# Patient Record
Sex: Female | Born: 1960 | Race: Black or African American | Hispanic: No | Smoking: Former smoker
Health system: Southern US, Community
[De-identification: ages and names within clinical notes are randomized; demographics above are authoritative.]

## PROBLEM LIST (undated history)

## (undated) DIAGNOSIS — E119 Type 2 diabetes mellitus without complications: Secondary | ICD-10-CM

## (undated) DIAGNOSIS — I10 Essential (primary) hypertension: Secondary | ICD-10-CM

## (undated) DIAGNOSIS — S83249A Other tear of medial meniscus, current injury, unspecified knee, initial encounter: Secondary | ICD-10-CM

## (undated) DIAGNOSIS — J302 Other seasonal allergic rhinitis: Secondary | ICD-10-CM

## (undated) DIAGNOSIS — G4730 Sleep apnea, unspecified: Secondary | ICD-10-CM

## (undated) DIAGNOSIS — F419 Anxiety disorder, unspecified: Secondary | ICD-10-CM

## (undated) DIAGNOSIS — M1990 Unspecified osteoarthritis, unspecified site: Secondary | ICD-10-CM

## (undated) DIAGNOSIS — T7840XA Allergy, unspecified, initial encounter: Secondary | ICD-10-CM

## (undated) DIAGNOSIS — E785 Hyperlipidemia, unspecified: Secondary | ICD-10-CM

## (undated) DIAGNOSIS — K219 Gastro-esophageal reflux disease without esophagitis: Secondary | ICD-10-CM

## (undated) DIAGNOSIS — M940 Chondrocostal junction syndrome [Tietze]: Secondary | ICD-10-CM

## (undated) HISTORY — PX: SPLENECTOMY: SUR1306

## (undated) HISTORY — DX: Gastro-esophageal reflux disease without esophagitis: K21.9

## (undated) HISTORY — DX: Other seasonal allergic rhinitis: J30.2

## (undated) HISTORY — DX: Type 2 diabetes mellitus without complications: E11.9

## (undated) HISTORY — DX: Hyperlipidemia, unspecified: E78.5

## (undated) HISTORY — DX: Allergy, unspecified, initial encounter: T78.40XA

## (undated) HISTORY — DX: Unspecified osteoarthritis, unspecified site: M19.90

## (undated) HISTORY — DX: Anxiety disorder, unspecified: F41.9

## (undated) HISTORY — DX: Essential (primary) hypertension: I10

---

## 1991-09-17 HISTORY — PX: OTHER SURGICAL HISTORY: SHX169

## 2014-09-05 ENCOUNTER — Other Ambulatory Visit: Payer: Self-pay | Admitting: Primary Care

## 2014-09-05 DIAGNOSIS — Z1231 Encounter for screening mammogram for malignant neoplasm of breast: Secondary | ICD-10-CM

## 2014-09-21 ENCOUNTER — Ambulatory Visit: Payer: Self-pay

## 2015-03-23 ENCOUNTER — Encounter: Payer: Self-pay | Admitting: Internal Medicine

## 2015-05-18 ENCOUNTER — Ambulatory Visit (AMBULATORY_SURGERY_CENTER): Payer: Self-pay | Admitting: *Deleted

## 2015-05-18 VITALS — Ht 65.0 in | Wt 213.4 lb

## 2015-05-18 DIAGNOSIS — Z1211 Encounter for screening for malignant neoplasm of colon: Secondary | ICD-10-CM

## 2015-05-18 NOTE — Progress Notes (Signed)
Denies allergies to eggs or soy products. Denies complications with sedation or anesthesia. Denies O2 use. Denies use of diet or weight loss medications.  Emmi instructions given for colonoscopy.  

## 2015-06-01 ENCOUNTER — Encounter: Payer: Self-pay | Admitting: Internal Medicine

## 2015-06-26 ENCOUNTER — Encounter: Payer: Self-pay | Admitting: Gastroenterology

## 2015-08-03 ENCOUNTER — Encounter: Payer: Self-pay | Admitting: Gastroenterology

## 2015-08-14 ENCOUNTER — Encounter: Payer: 59 | Admitting: Gastroenterology

## 2015-09-19 ENCOUNTER — Emergency Department (HOSPITAL_COMMUNITY)
Admission: EM | Admit: 2015-09-19 | Discharge: 2015-09-19 | Disposition: A | Discharge status: Home / Self Care | Payer: Self-pay | Source: Home / Self Care | Attending: Emergency Medicine | Admitting: Emergency Medicine

## 2015-09-19 ENCOUNTER — Encounter (HOSPITAL_COMMUNITY): Payer: Self-pay | Admitting: *Deleted

## 2015-09-19 ENCOUNTER — Emergency Department (INDEPENDENT_AMBULATORY_CARE_PROVIDER_SITE_OTHER): Payer: BLUE CROSS/BLUE SHIELD

## 2015-09-19 DIAGNOSIS — R42 Dizziness and giddiness: Secondary | ICD-10-CM

## 2015-09-19 DIAGNOSIS — J3489 Other specified disorders of nose and nasal sinuses: Secondary | ICD-10-CM

## 2015-09-19 NOTE — Discharge Instructions (Signed)
Dizziness °Dizziness is a common problem. It makes you feel unsteady or lightheaded. You may feel like you are about to pass out (faint). Dizziness can lead to injury if you stumble or fall. Anyone can get dizzy, but dizziness is more common in older adults. This condition can be caused by a number of things, including: °· Medicines. °· Dehydration. °· Illness. °HOME CARE °Following these instructions may help with your condition: °Eating and Drinking °· Drink enough fluid to keep your pee (urine) clear or pale yellow. This helps to keep you from getting dehydrated. Try to drink more clear fluids, such as water. °· Do not drink alcohol. °· Limit how much caffeine you drink or eat if told by your doctor. °· Limit how much salt you drink or eat if told by your doctor. °Activity °· Avoid making quick movements. °¨ When you stand up from sitting in a chair, steady yourself until you feel okay. °¨ In the morning, first sit up on the side of the bed. When you feel okay, stand slowly while you hold onto something. Do this until you know that your balance is fine. °· Move your legs often if you need to stand in one place for a long time. Tighten and relax your muscles in your legs while you are standing. °· Do not drive or use heavy machinery if you feel dizzy. °· Avoid bending down if you feel dizzy. Place items in your home so that they are easy for you to reach without leaning over. °Lifestyle °· Do not use any tobacco products, including cigarettes, chewing tobacco, or electronic cigarettes. If you need help quitting, ask your doctor. °· Try to lower your stress level, such as with yoga or meditation. Talk with your doctor if you need help. °General Instructions °· Watch your dizziness for any changes. °· Take medicines only as told by your doctor. Talk with your doctor if you think that your dizziness is caused by a medicine that you are taking. °· Tell a friend or a family member that you are feeling dizzy. If he or  she notices any changes in your behavior, have this person call your doctor. °· Keep all follow-up visits as told by your doctor. This is important. °GET HELP IF: °· Your dizziness does not go away. °· Your dizziness or light-headedness gets worse. °· You feel sick to your stomach (nauseous). °· You have trouble hearing. °· You have new symptoms. °· You are unsteady on your feet or you feel like the room is spinning. °GET HELP RIGHT AWAY IF: °· You throw up (vomit) or have diarrhea and are unable to eat or drink anything. °· You have trouble: °¨ Talking. °¨ Walking. °¨ Swallowing. °¨ Using your arms, hands, or legs. °· You feel generally weak. °· You are not thinking clearly or you have trouble forming sentences. It may take a friend or family member to notice this. °· You have: °¨ Chest pain. °¨ Pain in your belly (abdomen). °¨ Shortness of breath. °¨ Sweating. °· Your vision changes. °· You are bleeding. °· You have a headache. °· You have neck pain or a stiff neck. °· You have a fever. °  °This information is not intended to replace advice given to you by your health care provider. Make sure you discuss any questions you have with your health care provider. °  °Document Released: 08/22/2011 Document Revised: 01/17/2015 Document Reviewed: 08/29/2014 °Elsevier Interactive Patient Education ©2016 [employer] ° °

## 2015-09-19 NOTE — ED Provider Notes (Signed)
CSN: [id number]     Arrival date & time 09/19/15  1308 History   First MD Initiated Contact with Patient 09/19/15 1354     Chief Complaint  Patient presents with  . URI   (Consider location/radiation/quality/duration/timing/severity/associated sx's/prior Treatment) HPI Pt states that she was cooking and experienced an episode of dizziness that lasted about 1 minute. Also some nasal pain and pressure. Symptoms completely resolved and did not return.  Past Medical History  Diagnosis Date  . Seasonal allergies   . Anxiety   . Arthritis   . Diabetes (HCC)   . GERD (gastroesophageal reflux disease)   . Hyperlipidemia   . Hypertension    Past Surgical History  Procedure Laterality Date  . Splenectomy  childhood  . Ceasarean section  81993   Family History  Problem Relation Age of Onset  . Colon cancer Neg Hx    Social History  Substance Use Topics  . Smoking status: Former Smoker    Quit date: 09/16/1984  . Smokeless tobacco: Never Used  . Alcohol Use: 0.0 oz/week    0 Standard drinks or equivalent per week     Comment: occas   OB History    No data available     Review of Systems ROS +'ve sinus pain/pressure dizzy  Denies: HEADACHE, NAUSEA, ABDOMINAL PAIN, CHEST PAIN, CONGESTION, DYSURIA, SHORTNESS OF BREATH  Allergies  Aspirin  Home Medications   Prior to Admission medications   Medication Sig Start Date End Date Taking? Authorizing Provider  atenolol (TENORMIN) 25 MG tablet Take by mouth daily.    Historical Provider, MD  bisacodyl (BISACODYL) 5 MG EC tablet Take 5 mg by mouth once. One time use for colonoscopy    Historical Provider, MD  hydrOXYzine (ATARAX/VISTARIL) 50 MG tablet Take 50 mg by mouth 3 (three) times daily as needed.    Historical Provider, MD  ibuprofen (ADVIL,MOTRIN) 600 MG tablet Take 600 mg by mouth every 6 (six) hours as needed.    Historical Provider, MD  loratadine (CLARITIN) 10 MG tablet Take 10 mg by mouth daily.    Historical Provider, MD   metFORMIN (GLUCOPHAGE) 500 MG tablet Take by mouth daily with breakfast.    Historical Provider, MD  naproxen sodium (ANAPROX) 220 MG tablet Take 220 mg by mouth as needed.    Historical Provider, MD  omeprazole (PRILOSEC) 20 MG capsule Take 20 mg by mouth daily.    Historical Provider, MD  polyethylene glycol (MIRALAX / GLYCOLAX) packet Take 17 g by mouth once. One time use for colonoscopy    Historical Provider, MD  pravastatin (PRAVACHOL) 20 MG tablet Take 20 mg by mouth daily.    Historical Provider, MD  sertraline (ZOLOFT) 25 MG tablet Take 25 mg by mouth daily.    Historical Provider, MD   Meds Ordered and Administered this Visit  Medications - No data to display  BP 132/90 mmHg  Pulse 94  Temp(Src) 98.4 F (36.9 C) (Oral)  Resp 16  SpO2 98%  LMP 05/17/2012 (Approximate) No data found.   Physical Exam  Constitutional: She is oriented to person, place, and time. She appears well-developed and well-nourished.  HENT:  Head: Normocephalic and atraumatic.  Right Ear: External ear normal.  Left Ear: External ear normal.  Nose: Nose normal.  Mouth/Throat: Oropharynx is clear and moist.  Eyes: Conjunctivae are normal.  Neck: Normal range of motion.  Pulmonary/Chest: Effort normal and breath sounds normal.  Musculoskeletal: Normal range of motion.  Lymphadenopathy:  She has no cervical adenopathy.  Neurological: She is alert and oriented to person, place, and time.  Skin: Skin is warm and dry.  Psychiatric: She has a normal mood and affect. Her behavior is normal. Judgment and thought content normal.    ED Course  Procedures (including critical care time)  Labs Review Labs Reviewed - No data to display  Imaging Review Dg Sinuses Complete  09/19/2015  CLINICAL DATA:  Sinus pain and pressure. Dizziness. No fever. Productive sinus drainage. EXAM: PARANASAL SINUSES - COMPLETE 3 + VIEW COMPARISON:  None. FINDINGS: The paranasal sinus are aerated. There is no evidence of  sinus opacification air-fluid levels or mucosal thickening. No significant bone abnormalities are seen. IMPRESSION: Negative. Electronically Signed   By: Gerome Sam III M.D   On: 09/19/2015 14:48     Visual Acuity Review  Right Eye Distance:   Left Eye Distance:   Bilateral Distance:    Right Eye Near:   Left Eye Near:    Bilateral Near:         MDM   1. Sinus pressure   2. Sinus pain   3. Dizzy spells    No worrying findings on sinus films.  Continue symptomatic care at this time.  Push fluids, diet and activity as tolerated.     Tharon Aquas, PA 09/19/15 [phone]

## 2015-09-19 NOTE — ED Notes (Signed)
Pt  Reports  Symptoms  Of  dizzyness     Headache    Sinus  Pressure     Symptoms  Started  About    4  Days  Ago          Pt  Reports   Symptoms      Not  releived  By otc   meds

## 2016-12-05 ENCOUNTER — Ambulatory Visit: Payer: BLUE CROSS/BLUE SHIELD | Attending: Primary Care

## 2016-12-05 DIAGNOSIS — M6281 Muscle weakness (generalized): Secondary | ICD-10-CM | POA: Diagnosis present

## 2016-12-05 DIAGNOSIS — M545 Low back pain, unspecified: Secondary | ICD-10-CM

## 2016-12-05 DIAGNOSIS — M62830 Muscle spasm of back: Secondary | ICD-10-CM | POA: Insufficient documentation

## 2016-12-05 NOTE — Patient Instructions (Signed)
From cabinet  HEP 2x/day 30 sec stretching x2-3 reps RT/Lt    Single knee to chest,  LTR ,  Childs pose,  PPT,
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
## 2016-12-16 ENCOUNTER — Ambulatory Visit: Payer: BLUE CROSS/BLUE SHIELD

## 2017-07-23 ENCOUNTER — Ambulatory Visit (INDEPENDENT_AMBULATORY_CARE_PROVIDER_SITE_OTHER): Payer: BLUE CROSS/BLUE SHIELD

## 2017-07-23 ENCOUNTER — Ambulatory Visit (HOSPITAL_COMMUNITY)
Admission: EM | Admit: 2017-07-23 | Discharge: 2017-07-23 | Disposition: A | Discharge status: Home / Self Care | Payer: BLUE CROSS/BLUE SHIELD | Attending: Family Medicine | Admitting: Family Medicine

## 2017-07-23 ENCOUNTER — Other Ambulatory Visit: Payer: Self-pay

## 2017-07-23 ENCOUNTER — Encounter (HOSPITAL_COMMUNITY): Payer: Self-pay | Admitting: Emergency Medicine

## 2017-07-23 DIAGNOSIS — K219 Gastro-esophageal reflux disease without esophagitis: Secondary | ICD-10-CM

## 2017-07-23 DIAGNOSIS — R0602 Shortness of breath: Secondary | ICD-10-CM | POA: Diagnosis not present

## 2017-07-23 DIAGNOSIS — F411 Generalized anxiety disorder: Secondary | ICD-10-CM | POA: Diagnosis not present

## 2017-07-23 NOTE — ED Triage Notes (Signed)
Pt reports SOB with light exertion for the last two weeks.  She denies any illness in the last two weeks.  She got up and walked to the chair to get something out of her purse and walked back to the pt table and appeared to be winded with just that short amount of exertion, maybe a 6 ft walk.  She also reports being "very gassy" as well.  Pt belching while in the room.  She denies any CP or headaches.  She reports arthritis in her "chest bones".

## 2017-07-23 NOTE — ED Provider Notes (Addendum)
[facility]    CSN: [id number] Arrival date & time: 07/23/17  1047     History   Chief Complaint Chief Complaint  Patient presents with  . Shortness of Breath    HPI Desiree Mckinney is a 56 y.o. female.   56 year old female presents to the urgent care with a two-week history of "trouble breathing". When she tries to explain her complaints she states that this is actually having to take a deep breath after some exertion such as climbing stairs or descending stairs. She does not feel short of breath after having taken that first deep breath. Denies shortness of breath. Denies symptoms at rest. Denies cough, fever or chest pain. She does state that she has a weird feeling across the anterior chest. Denies heaviness, tightness, fullness, pressure, sharp pain or other describable discomfort, just a "weird feeling in the anterior chest. She is also having increased belching. And equivocally reflux type symptoms. Although she never mentioned a history of GERD it is noted in the chart. No hx of lung problems other than asthma over 20 yrs ago, no known heart dz. Patient denied additional medical problems however the PMH lists, diabetes, hypertension, dyslipidemia and anxiety.      Past Medical History:  Diagnosis Date  . Anxiety   . Arthritis   . Diabetes (HCC)   . GERD (gastroesophageal reflux disease)   . Hyperlipidemia   . Hypertension   . Seasonal allergies     There are no active problems to display for this patient.   Past Surgical History:  Procedure Laterality Date  . ceasarean section  1993  . SPLENECTOMY  childhood    OB History    No data available       Home Medications    Prior to Admission medications   Medication Sig Start Date End Date Taking? Authorizing Provider  atenolol (TENORMIN) 25 MG tablet Take by mouth daily.   Yes [provider]  clonazePAM (KLONOPIN) 0.5 MG tablet Take 0.5 mg 2 (two) times daily by mouth.   Yes  [provider]  lisinopril (PRINIVIL,ZESTRIL) 10 MG tablet Take 10 mg by mouth daily.   Yes [provider]  methocarbamol (ROBAXIN) 500 MG tablet Take 1,000 mg 3 (three) times daily by mouth.   Yes [provider]  pravastatin (PRAVACHOL) 20 MG tablet Take 20 mg by mouth daily.   Yes [provider]  bisacodyl (BISACODYL) 5 MG EC tablet Take 5 mg by mouth once. One time use for colonoscopy    [provider]  hydrOXYzine (ATARAX/VISTARIL) 50 MG tablet Take 50 mg by mouth 3 (three) times daily as needed.    [provider]  ibuprofen (ADVIL,MOTRIN) 600 MG tablet Take 600 mg by mouth every 6 (six) hours as needed.    [provider]  loratadine (CLARITIN) 10 MG tablet Take 10 mg by mouth daily.    [provider]  naproxen sodium (ANAPROX) 220 MG tablet Take 220 mg by mouth as needed.    [provider]  omeprazole (PRILOSEC) 20 MG capsule Take 20 mg by mouth daily.    [provider]  polyethylene glycol (MIRALAX / GLYCOLAX) packet Take 17 g by mouth once. One time use for colonoscopy    [provider]  sertraline (ZOLOFT) 25 MG tablet Take 25 mg by mouth daily.    [provider]    Family History Family History  Problem Relation Age of Onset  . Colon cancer  Neg Hx     Social History Social History   Tobacco Use  . Smoking status: Former Smoker    Last attempt to quit: 09/16/1984    Years since quitting: 32.8  . Smokeless tobacco: Never Used  Substance Use Topics  . Alcohol use: Yes    Alcohol/week: 0.0 oz    Comment: occas  . Drug use: No     Allergies   Aspirin   Review of Systems Review of Systems  Constitutional: Negative for activity change, fatigue and fever.  HENT: Negative.   Eyes: Negative.   Respiratory: Negative for cough and chest tightness.        As per history of present illness  Cardiovascular: Negative.  Negative for palpitations and leg  swelling.  Gastrointestinal:       Increased belching.  Genitourinary: Negative.   Skin: Negative.   Neurological: Negative.   All other systems reviewed and are negative.    Physical Exam Triage Vital Signs ED Triage Vitals [07/23/17 1110]  Enc Vitals Group     BP (!) 152/84     Pulse Rate 88     Resp      Temp 99.3 F (37.4 C)     Temp Source Oral     SpO2 97 %     Weight      Height      Head Circumference      Peak Flow      Pain Score      Pain Loc      Pain Edu?      Excl. in GC?    No data found.  Updated Vital Signs BP (!) 152/84 (BP Location: Left Arm)   Pulse 88   Temp 99.3 F (37.4 C) (Oral)   LMP 05/17/2012 (Approximate)   SpO2 97%   Visual Acuity Right Eye Distance:   Left Eye Distance:   Bilateral Distance:    Right Eye Near:   Left Eye Near:    Bilateral Near:     Physical Exam  Constitutional: She is oriented to person, place, and time. She appears well-developed and well-nourished.  Non-toxic appearance. She does not appear ill. No distress.  HENT:  Head: Normocephalic and atraumatic.  Eyes: EOM are normal.  Neck: Normal range of motion. Neck supple.  Cardiovascular: Normal rate, regular rhythm, normal heart sounds and intact distal pulses.  Pulmonary/Chest: Effort normal and breath sounds normal. No respiratory distress. She has no decreased breath sounds. She exhibits no tenderness.  Good air movement. No adventitious sounds. Inspiratory equals expiratory phases  Musculoskeletal: She exhibits no edema.       Right lower leg: She exhibits no edema.       Left lower leg: She exhibits no edema.  Neurological: She is alert and oriented to person, place, and time. She exhibits normal muscle tone.  Skin: Skin is warm and dry.  Psychiatric: She has a normal mood and affect.  Nursing note and vitals reviewed.    UC Treatments / Results  Labs (all labs ordered are listed, but only abnormal results are displayed) Labs Reviewed - No data  to display  EKG  EKG Interpretation None       Radiology Dg Chest 2 View  Result Date: 07/23/2017 CLINICAL DATA:  Shortness of breath for 3 days, worsening. EXAM: CHEST  2 VIEW COMPARISON:  None. FINDINGS: Lungs are clear. Heart size is normal. No pneumothorax or pleural fluid. No bony abnormality. IMPRESSION: Negative chest. Electronically Signed  By: Drusilla Kannerhomas  Dalessio M.D.   On: 07/23/2017 12:18    Procedures Procedures (including critical care time)  Medications Ordered in UC Medications - No data to display   Initial Impression / Assessment and Plan / UC Course  I have reviewed the triage vital signs and the nursing notes.  Pertinent labs & imaging results that were available during my care of the patient were reviewed by me and considered in my medical decision making (see chart for details).    Start taking omeprazole or Nexium once a day. May also take Zantac 150 mg twice a day. You can also take Gas-X or simethicone to help release gas in the stomach. If you develop heaviness, tightness, fullness or pressure in the chest or radiation of pain into the neck or jaw, sweating, nausea or vomiting, shortness of breath and called 911. As of this time your chest x-ray is normal, EKG normal, no shortness of breath and a normal oxygen saturation when walking down the hall and back. The physical exam did not find any thing abnormal. Follow-up with your primary care doctor, call for an appointment soon.   Final Clinical Impressions(s) / UC Diagnoses   Final diagnoses:  SOB (shortness of breath)  Generalized anxiety disorder  Gastroesophageal reflux disease, esophagitis presence not specified    ED Discharge Orders    None    ED ECG REPORT   Date: 07/23/2017  Rate: 66  Rhythm: normal sinus rhythm  QRS Axis: normal  Intervals: normal  ST/T Wave abnormalities: normal  Conduction Disutrbances:none  Narrative Interpretation:   Old EKG Reviewed: none available  I have  personally reviewed the EKG tracing and agree with the computerized printout as noted.    Controlled Substance Prescriptions [place] Controlled Substance Registry consulted? Not Applicable   Hayden RasmussenMabe, Kimla Furth, NP 07/23/17 1309    Hayden RasmussenMabe, Josephene Marrone, NP 07/23/17 1659

## 2017-07-23 NOTE — Discharge Instructions (Signed)
Start taking omeprazole or Nexium once a day. May also take Zantac 150 mg twice a day. You can also take Gas-X or simethicone to help release gas in the stomach. If you develop heaviness, tightness, fullness or pressure in the chest or radiation of pain into the neck or jaw, sweating, nausea or vomiting, shortness of breath and called 911. As of this time your chest x-ray is normal, EKG normal, no shortness of breath and a normal oxygen saturation when walking down the hall and back. The physical exam did not find any thing abnormal. Follow-up with your primary care doctor, call for an appointment soon.

## 2018-04-23 ENCOUNTER — Other Ambulatory Visit: Payer: Self-pay | Admitting: Orthopaedic Surgery

## 2018-04-23 DIAGNOSIS — M25561 Pain in right knee: Secondary | ICD-10-CM

## 2018-04-30 ENCOUNTER — Ambulatory Visit
Admission: RE | Admit: 2018-04-30 | Discharge: 2018-04-30 | Disposition: A | Discharge status: Home / Self Care | Payer: BLUE CROSS/BLUE SHIELD | Source: Ambulatory Visit | Attending: Orthopaedic Surgery | Admitting: Orthopaedic Surgery

## 2018-04-30 DIAGNOSIS — M25561 Pain in right knee: Secondary | ICD-10-CM

## 2018-05-11 ENCOUNTER — Ambulatory Visit (INDEPENDENT_AMBULATORY_CARE_PROVIDER_SITE_OTHER): Payer: BLUE CROSS/BLUE SHIELD

## 2018-05-11 ENCOUNTER — Encounter (HOSPITAL_COMMUNITY): Payer: Self-pay | Admitting: Emergency Medicine

## 2018-05-11 ENCOUNTER — Ambulatory Visit (HOSPITAL_COMMUNITY)
Admission: EM | Admit: 2018-05-11 | Discharge: 2018-05-11 | Disposition: A | Discharge status: Home / Self Care | Payer: BLUE CROSS/BLUE SHIELD | Attending: Family Medicine | Admitting: Family Medicine

## 2018-05-11 DIAGNOSIS — M940 Chondrocostal junction syndrome [Tietze]: Secondary | ICD-10-CM | POA: Diagnosis not present

## 2018-05-11 DIAGNOSIS — R0789 Other chest pain: Secondary | ICD-10-CM | POA: Diagnosis not present

## 2018-05-11 LAB — POCT I-STAT, CHEM 8
BUN: 16 mg/dL (ref 6–20)
CREATININE: 0.7 mg/dL (ref 0.44–1.00)
Calcium, Ion: 1.19 mmol/L (ref 1.15–1.40)
Chloride: 107 mmol/L (ref 98–111)
GLUCOSE: 133 mg/dL — AB (ref 70–99)
HEMATOCRIT: 38 % (ref 36.0–46.0)
Hemoglobin: 12.9 g/dL (ref 12.0–15.0)
Potassium: 4 mmol/L (ref 3.5–5.1)
Sodium: 144 mmol/L (ref 135–145)
TCO2: 26 mmol/L (ref 22–32)

## 2018-05-11 MED ORDER — MELOXICAM 15 MG PO TABS: 15.0000 mg | ORAL_TABLET | Freq: Every day | ORAL | 0 refills | Status: DC

## 2018-05-11 MED ORDER — METHYLPREDNISOLONE 4 MG PO TBPK: ORAL_TABLET | ORAL | 0 refills | Status: DC

## 2018-05-11 NOTE — ED Triage Notes (Signed)
Pt sts mid sternal CP worse with movement and inspiration x 1 week

## 2018-05-11 NOTE — ED Provider Notes (Signed)
[facility]    CSN: [id number] Arrival date & time: 05/11/18  1320     History   Chief Complaint Chief Complaint  Patient presents with  . Chest Pain    HPI Desiree Mckinney is a 57 y.o. female.   HPI  Patient is here with a chief complaint of chest pain.  She states she does have a past history of costochondritis.  This feels similar to her past costochondritis.  She has pain in the center of her chest that is worse with deep breath, cough, or arm movement.  She has taken ibuprofen for the pain.  This improves the pain to make it better, briefly, and the pain comes back as since she stops the medication.  She said no fall or trauma.  No change in her activity level.  She has had no recent cough or chest infection.  It is not worse with activity.  She has no history of cardiac disease.  She has no shortness of breath or associated symptoms, no radiation of pain, no diaphoresis.  She does have hypertension, hyperlipidemia, and diabetes all of which are well controlled. On her initial presentation she came in and had an EKG the EKG is read as "abnormal".  She had an old EKG performed in November 2018 which was normal sinus rhythm, normal EKG.  The EKG performed today states she has a nonspecific T wave abnormality with a prolonged QT, abnormal ECG.  I explained to the patient that with an abnormal ECG she should go to the emergency room for care.  The patient hesitates, stating that this is typical for her costochondritis pain and she would like for me to treat the costochondritis.  She understands that if she gets worse instead of better, she would need to go to the emergency room.  Past Medical History:  Diagnosis Date  . Anxiety   . Arthritis   . Diabetes (HCC)   . GERD (gastroesophageal reflux disease)   . Hyperlipidemia   . Hypertension   . Seasonal allergies     There are no active problems to display for this patient.   Past Surgical History:  Procedure  Laterality Date  . ceasarean section  1993  . SPLENECTOMY  childhood    OB History   None      Home Medications    Prior to Admission medications   Medication Sig Start Date End Date Taking? Authorizing Provider  atenolol (TENORMIN) 25 MG tablet Take by mouth daily.    [provider]  bisacodyl (BISACODYL) 5 MG EC tablet Take 5 mg by mouth once. One time use for colonoscopy    [provider]  clonazePAM (KLONOPIN) 0.5 MG tablet Take 0.5 mg 2 (two) times daily by mouth.    [provider]  hydrOXYzine (ATARAX/VISTARIL) 50 MG tablet Take 50 mg by mouth 3 (three) times daily as needed.    [provider]  ibuprofen (ADVIL,MOTRIN) 600 MG tablet Take 600 mg by mouth every 6 (six) hours as needed.    [provider]  lisinopril (PRINIVIL,ZESTRIL) 10 MG tablet Take 10 mg by mouth daily.    [provider]  loratadine (CLARITIN) 10 MG tablet Take 10 mg by mouth daily.    [provider]  meloxicam (MOBIC) 15 MG tablet Take 1 tablet (15 mg total) by mouth daily. 05/11/18   Eustace Moore, MD  methocarbamol (ROBAXIN) 500 MG tablet Take 1,000 mg 3 (three) times daily by mouth.  [provider]  methylPREDNISolone (MEDROL DOSEPAK) 4 MG TBPK tablet tad 05/11/18   Eustace MooreNelson, Alyah Boehning Sue, MD  naproxen sodium (ANAPROX) 220 MG tablet Take 220 mg by mouth as needed.    [provider]  omeprazole (PRILOSEC) 20 MG capsule Take 20 mg by mouth daily.    [provider]  polyethylene glycol (MIRALAX / GLYCOLAX) packet Take 17 g by mouth once. One time use for colonoscopy    [provider]  pravastatin (PRAVACHOL) 20 MG tablet Take 20 mg by mouth daily.    [provider]  sertraline (ZOLOFT) 25 MG tablet Take 25 mg by mouth daily.    [provider]    Family History Family History  Problem Relation Age of Onset  . Colon cancer Neg Hx     Social History Social History   Tobacco  Use  . Smoking status: Former Smoker    Last attempt to quit: 09/16/1984    Years since quitting: 33.6  . Smokeless tobacco: Never Used  Substance Use Topics  . Alcohol use: Yes    Alcohol/week: 0.0 standard drinks    Comment: occas  . Drug use: No     Allergies   Aspirin   Review of Systems Review of Systems  Constitutional: Negative for chills, diaphoresis and fever.  HENT: Negative for congestion, ear pain and sore throat.   Eyes: Negative for pain and visual disturbance.  Respiratory: Negative for cough and shortness of breath.   Cardiovascular: Positive for chest pain. Negative for palpitations and leg swelling.  Gastrointestinal: Negative for abdominal pain, nausea and vomiting.  Genitourinary: Negative for dysuria and hematuria.  Musculoskeletal: Negative for arthralgias and back pain.  Skin: Negative for color change and rash.  Neurological: Negative for seizures and syncope.  Psychiatric/Behavioral: Negative for dysphoric mood. The patient is not nervous/anxious.        History of anxiety, but does not feel nervous at this time  All other systems reviewed and are negative.    Physical Exam Triage Vital Signs ED Triage Vitals [05/11/18 1334]  Enc Vitals Group     BP (!) 153/71     Pulse Rate 90     Resp 18     Temp 98 F (36.7 C)     Temp Source Oral     SpO2 99 %   No data found.  Updated Vital Signs BP (!) 153/71 (BP Location: Right Arm)   Pulse 90   Temp 98 F (36.7 C) (Oral)   Resp 18   LMP 05/17/2012 (Approximate)   SpO2 99%      Physical Exam  Constitutional: She is oriented to person, place, and time. She appears well-developed and well-nourished.  Non-toxic appearance. She does not appear ill. No distress.  HENT:  Head: Normocephalic and atraumatic.  Mouth/Throat: Oropharynx is clear and moist.  Eyes: Pupils are equal, round, and reactive to light. Conjunctivae are normal.  Neck: Normal range of motion. Neck supple. No JVD present.    Cardiovascular: Normal rate and regular rhythm.  No murmur heard. Pulmonary/Chest: Effort normal and breath sounds normal. No respiratory distress. She has no decreased breath sounds. She has no wheezes. She has no rales.  Chest wall exquisitely tender to palpation of sternum  Abdominal: Soft. Bowel sounds are normal. She exhibits no distension.  Musculoskeletal: Normal range of motion. She exhibits no edema.       Right lower leg: Normal. She exhibits no tenderness and no edema.  Left lower leg: Normal. She exhibits no tenderness and no edema.  Neurological: She is alert and oriented to person, place, and time.  Skin: Skin is warm and dry. No rash noted.  Psychiatric: She has a normal mood and affect. Her behavior is normal.     UC Treatments / Results  Labs (all labs ordered are listed, but only abnormal results are displayed) Labs Reviewed  POCT I-STAT, CHEM 8 - Abnormal; Notable for the following components:      Result Value   Glucose, Bld 133 (*)    All other components within normal limits    EKg-this is discussed with the patient.  She does have some nonspecific findings with minor T wave abnormalities, nothing that would indicate acute coronary syndrome.  Her history and clinical exam findings are consistent with costochondritis.  She is given a copy of her EKG to share with her family practice doctor next week "Normal sinus rhythm Nonspecific T wave abnormality Prolonged QT Abnormal ECG Since previous tracing QT has lengthened Confirmed by [place], Peter [phone]) on 05/11/2018 3:48:51 PM" None  Radiology Dg Chest 2 View  Result Date: 05/11/2018 CLINICAL DATA:  Chest pain for the past week. EXAM: CHEST - 2 VIEW COMPARISON:  07/23/2017. FINDINGS: Normal sized heart. Clear lungs. Minimal peribronchial thickening. Minimal thoracic spine degenerative changes. IMPRESSION: Minimal bronchitic changes. Electronically Signed   By: Beckie Salts M.D.   On: 05/11/2018 14:40     Procedures Procedures (including critical care time)  Medications Ordered in UC Medications - No data to display  Initial Impression / Assessment and Plan / UC Course  I have reviewed the triage vital signs and the nursing notes.  Pertinent labs & imaging results that were available during my care of the patient were reviewed by me and considered in my medical decision making (see chart for details).      Final Clinical Impressions(s) / UC Diagnoses   Final diagnoses:  Costochondritis     Discharge Instructions     Take the Medrol Dosepak as directed Take all of day 1 today When you are done with the Dosepak take meloxicam 1 pill a day with food Take this for at least another week or 2 until chest wall pain is gone Do not take Aleve or ibuprofen while on meloxicam Follow-up with your primary care doctor Go to ER if you are worse at any time instead of better    ED Prescriptions    Medication Sig Dispense Auth. Provider   methylPREDNISolone (MEDROL DOSEPAK) 4 MG TBPK tablet tad 21 tablet Eustace Moore, MD   meloxicam (MOBIC) 15 MG tablet Take 1 tablet (15 mg total) by mouth daily. 30 tablet Eustace Moore, MD     Controlled Substance Prescriptions [place] Controlled Substance Registry consulted? Not Applicable   Eustace Moore, MD 05/11/18 [phone]

## 2018-05-11 NOTE — Discharge Instructions (Signed)
Take the Medrol Dosepak as directed Take all of day 1 today When you are done with the Dosepak take meloxicam 1 pill a day with food Take this for at least another week or 2 until chest wall pain is gone Do not take Aleve or ibuprofen while on meloxicam Follow-up with your primary care doctor Go to ER if you are worse at any time instead of better

## 2018-07-08 ENCOUNTER — Encounter (HOSPITAL_BASED_OUTPATIENT_CLINIC_OR_DEPARTMENT_OTHER): Payer: Self-pay | Admitting: *Deleted

## 2018-07-08 ENCOUNTER — Other Ambulatory Visit: Payer: Self-pay

## 2018-07-08 NOTE — Progress Notes (Signed)
Patient told to arrive to PAT for BMET and EKG in the next few days and pt said she will be coming tomorrow to have this done 07/09/18. Pt had been to urgent care on 05/11/18 with SOB and chest discomfort where EKG was done showing prolonged QT interval and  t wave abnormalities. These findings were different than EKG from 07/23/17. Dr Hyacinth Meeker aware and he wants EKG done when pt here for BMET.

## 2018-07-09 ENCOUNTER — Other Ambulatory Visit: Payer: Self-pay

## 2018-07-09 ENCOUNTER — Encounter (HOSPITAL_BASED_OUTPATIENT_CLINIC_OR_DEPARTMENT_OTHER)
Admission: RE | Admit: 2018-07-09 | Discharge: 2018-07-09 | Disposition: A | Discharge status: Home / Self Care | Payer: BLUE CROSS/BLUE SHIELD | Source: Ambulatory Visit | Attending: Orthopaedic Surgery | Admitting: Orthopaedic Surgery

## 2018-07-09 DIAGNOSIS — Z01818 Encounter for other preprocedural examination: Secondary | ICD-10-CM | POA: Diagnosis not present

## 2018-07-09 LAB — BASIC METABOLIC PANEL
ANION GAP: 4 — AB (ref 5–15)
BUN: 11 mg/dL (ref 6–20)
CHLORIDE: 109 mmol/L (ref 98–111)
CO2: 26 mmol/L (ref 22–32)
Calcium: 9.1 mg/dL (ref 8.9–10.3)
Creatinine, Ser: 0.66 mg/dL (ref 0.44–1.00)
GFR calc non Af Amer: >60 mL/min (ref >60)
GLUCOSE: 126 mg/dL — AB (ref 70–99)
Potassium: 4.3 mmol/L (ref 3.5–5.1)
Sodium: 139 mmol/L (ref 135–145)

## 2018-07-15 ENCOUNTER — Encounter (HOSPITAL_BASED_OUTPATIENT_CLINIC_OR_DEPARTMENT_OTHER): Payer: Self-pay | Admitting: Anesthesiology

## 2018-07-15 NOTE — Anesthesia Preprocedure Evaluation (Addendum)
Anesthesia Evaluation  Patient identified by MRN, date of birth, ID band Patient awake    Reviewed: Allergy & Precautions, NPO status , Patient's Chart, lab work & pertinent test results, reviewed documented beta blocker date and time   Airway Mallampati: II  TM Distance: >3 FB Neck ROM: Full    Dental no notable dental hx. (+) Teeth Intact   Pulmonary former smoker,    Pulmonary exam normal breath sounds clear to auscultation       Cardiovascular hypertension, Pt. on medications and Pt. on home beta blockers Normal cardiovascular exam Rhythm:Regular Rate:Normal     Neuro/Psych Anxiety negative neurological ROS     GI/Hepatic Neg liver ROS, GERD  Medicated and Controlled,  Endo/Other  diabetes, Well Controlled, Type 2Obesity Hyperlipidemia  Renal/GU negative Renal ROS  negative genitourinary   Musculoskeletal  (+) Arthritis , Osteoarthritis,  Torn Medial Meniscus left knee   Abdominal (+) + obese,   Peds  Hematology negative hematology ROS (+)   Anesthesia Other Findings   Reproductive/Obstetrics                            Anesthesia Physical Anesthesia Plan  ASA: II  Anesthesia Plan: General   Post-op Pain Management:    Induction: Intravenous  PONV Risk Score and Plan: 4 or greater and Ondansetron, Dexamethasone, Midazolam, Scopolamine patch - Pre-op and Treatment may vary due to age or medical condition  Airway Management Planned: LMA  Additional Equipment:   Intra-op Plan:   Post-operative Plan: Extubation in OR  Informed Consent: I have reviewed the patients History and Physical, chart, labs and discussed the procedure including the risks, benefits and alternatives for the proposed anesthesia with the patient or authorized representative who has indicated his/her understanding and acceptance.   Dental advisory given  Plan Discussed with: CRNA and Surgeon  Anesthesia  Plan Comments:        Anesthesia Quick Evaluation

## 2018-07-16 ENCOUNTER — Ambulatory Visit (HOSPITAL_BASED_OUTPATIENT_CLINIC_OR_DEPARTMENT_OTHER): Payer: BLUE CROSS/BLUE SHIELD | Admitting: Anesthesiology

## 2018-07-16 ENCOUNTER — Encounter (HOSPITAL_BASED_OUTPATIENT_CLINIC_OR_DEPARTMENT_OTHER): Payer: Self-pay

## 2018-07-16 ENCOUNTER — Encounter (HOSPITAL_BASED_OUTPATIENT_CLINIC_OR_DEPARTMENT_OTHER)
Admission: RE | Disposition: A | Discharge status: Home / Self Care | Payer: Self-pay | Source: Ambulatory Visit | Attending: Orthopaedic Surgery

## 2018-07-16 ENCOUNTER — Other Ambulatory Visit: Payer: Self-pay

## 2018-07-16 ENCOUNTER — Ambulatory Visit (HOSPITAL_BASED_OUTPATIENT_CLINIC_OR_DEPARTMENT_OTHER)
Admission: RE | Admit: 2018-07-16 | Discharge: 2018-07-16 | Disposition: A | Discharge status: Home / Self Care | Payer: BLUE CROSS/BLUE SHIELD | Source: Ambulatory Visit | Attending: Orthopaedic Surgery | Admitting: Orthopaedic Surgery

## 2018-07-16 DIAGNOSIS — Z886 Allergy status to analgesic agent status: Secondary | ICD-10-CM | POA: Diagnosis not present

## 2018-07-16 DIAGNOSIS — E669 Obesity, unspecified: Secondary | ICD-10-CM | POA: Insufficient documentation

## 2018-07-16 DIAGNOSIS — Z79899 Other long term (current) drug therapy: Secondary | ICD-10-CM | POA: Insufficient documentation

## 2018-07-16 DIAGNOSIS — M2341 Loose body in knee, right knee: Secondary | ICD-10-CM | POA: Insufficient documentation

## 2018-07-16 DIAGNOSIS — M23221 Derangement of posterior horn of medial meniscus due to old tear or injury, right knee: Secondary | ICD-10-CM | POA: Diagnosis not present

## 2018-07-16 DIAGNOSIS — Z7984 Long term (current) use of oral hypoglycemic drugs: Secondary | ICD-10-CM | POA: Diagnosis not present

## 2018-07-16 DIAGNOSIS — I10 Essential (primary) hypertension: Secondary | ICD-10-CM | POA: Insufficient documentation

## 2018-07-16 DIAGNOSIS — M1711 Unilateral primary osteoarthritis, right knee: Secondary | ICD-10-CM | POA: Insufficient documentation

## 2018-07-16 DIAGNOSIS — Z87891 Personal history of nicotine dependence: Secondary | ICD-10-CM | POA: Diagnosis not present

## 2018-07-16 DIAGNOSIS — M1990 Unspecified osteoarthritis, unspecified site: Secondary | ICD-10-CM | POA: Diagnosis not present

## 2018-07-16 DIAGNOSIS — E785 Hyperlipidemia, unspecified: Secondary | ICD-10-CM | POA: Insufficient documentation

## 2018-07-16 DIAGNOSIS — F419 Anxiety disorder, unspecified: Secondary | ICD-10-CM | POA: Diagnosis not present

## 2018-07-16 DIAGNOSIS — E119 Type 2 diabetes mellitus without complications: Secondary | ICD-10-CM | POA: Insufficient documentation

## 2018-07-16 DIAGNOSIS — K219 Gastro-esophageal reflux disease without esophagitis: Secondary | ICD-10-CM | POA: Insufficient documentation

## 2018-07-16 DIAGNOSIS — Z6834 Body mass index (BMI) 34.0-34.9, adult: Secondary | ICD-10-CM | POA: Insufficient documentation

## 2018-07-16 HISTORY — PX: KNEE ARTHROSCOPY WITH MEDIAL MENISECTOMY: SHX5651

## 2018-07-16 HISTORY — PX: CHONDROPLASTY: SHX5177

## 2018-07-16 LAB — GLUCOSE, CAPILLARY
GLUCOSE-CAPILLARY: 101 mg/dL — AB (ref 70–99)
GLUCOSE-CAPILLARY: 107 mg/dL — AB (ref 70–99)

## 2018-07-16 SURGERY — ARTHROSCOPY, KNEE, WITH MEDIAL MENISCECTOMY
Anesthesia: General | Site: Knee | Laterality: Right

## 2018-07-16 MED ORDER — SODIUM CHLORIDE 0.9 % IR SOLN
Status: DC | PRN
  Administered 2018-07-16: 2000 mL

## 2018-07-16 MED ORDER — FENTANYL CITRATE (PF) 100 MCG/2ML IJ SOLN
INTRAMUSCULAR | Status: AC
  Filled 2018-07-16: qty 2

## 2018-07-16 MED ORDER — MIDAZOLAM HCL 2 MG/2ML IJ SOLN
INTRAMUSCULAR | Status: AC
  Filled 2018-07-16: qty 2

## 2018-07-16 MED ORDER — OXYCODONE HCL 5 MG PO TABS
5.0000 mg | ORAL_TABLET | Freq: Once | ORAL | Status: AC
  Administered 2018-07-16: 5 mg via ORAL

## 2018-07-16 MED ORDER — BUPIVACAINE HCL (PF) 0.25 % IJ SOLN
INTRAMUSCULAR | Status: DC | PRN
  Administered 2018-07-16: 20 mL via INTRA_ARTICULAR

## 2018-07-16 MED ORDER — ACETAMINOPHEN 500 MG PO TABS: 1000.0000 mg | ORAL_TABLET | Freq: Three times a day (TID) | ORAL | 0 refills | Status: AC

## 2018-07-16 MED ORDER — PROPOFOL 500 MG/50ML IV EMUL
INTRAVENOUS | Status: AC
  Filled 2018-07-16: qty 50

## 2018-07-16 MED ORDER — HYDROCODONE-ACETAMINOPHEN 7.5-325 MG PO TABS: 1.0000 | ORAL_TABLET | Freq: Once | ORAL | Status: DC | PRN

## 2018-07-16 MED ORDER — ASPIRIN 81 MG PO TABS: 81.0000 mg | ORAL_TABLET | Freq: Every day | ORAL | 0 refills | Status: AC

## 2018-07-16 MED ORDER — OXYCODONE HCL 5 MG PO TABS: ORAL_TABLET | ORAL | 0 refills | Status: AC

## 2018-07-16 MED ORDER — DEXAMETHASONE SODIUM PHOSPHATE 10 MG/ML IJ SOLN
INTRAMUSCULAR | Status: AC
  Filled 2018-07-16: qty 1

## 2018-07-16 MED ORDER — FENTANYL CITRATE (PF) 100 MCG/2ML IJ SOLN
50.0000 ug | INTRAMUSCULAR | Status: DC | PRN
  Administered 2018-07-16: 100 ug via INTRAVENOUS
  Administered 2018-07-16: 50 ug via INTRAVENOUS

## 2018-07-16 MED ORDER — LIDOCAINE 2% (20 MG/ML) 5 ML SYRINGE
INTRAMUSCULAR | Status: DC | PRN
  Administered 2018-07-16: 50 mg via INTRAVENOUS

## 2018-07-16 MED ORDER — CEFAZOLIN SODIUM-DEXTROSE 2-4 GM/100ML-% IV SOLN
2.0000 g | INTRAVENOUS | Status: AC
  Administered 2018-07-16: 2 g via INTRAVENOUS

## 2018-07-16 MED ORDER — OXYCODONE HCL 5 MG PO TABS
ORAL_TABLET | ORAL | Status: AC
  Filled 2018-07-16: qty 1

## 2018-07-16 MED ORDER — MIDAZOLAM HCL 2 MG/2ML IJ SOLN
1.0000 mg | INTRAMUSCULAR | Status: DC | PRN
  Administered 2018-07-16: 2 mg via INTRAVENOUS

## 2018-07-16 MED ORDER — LACTATED RINGERS IV SOLN
INTRAVENOUS | Status: DC
  Administered 2018-07-16: 07:00:00 via INTRAVENOUS

## 2018-07-16 MED ORDER — MEPERIDINE HCL 25 MG/ML IJ SOLN: 6.2500 mg | INTRAMUSCULAR | Status: DC | PRN

## 2018-07-16 MED ORDER — PROPOFOL 10 MG/ML IV BOLUS
INTRAVENOUS | Status: DC | PRN
  Administered 2018-07-16: 180 mg via INTRAVENOUS

## 2018-07-16 MED ORDER — ONDANSETRON HCL 4 MG PO TABS: 4.0000 mg | ORAL_TABLET | Freq: Three times a day (TID) | ORAL | 1 refills | Status: AC | PRN

## 2018-07-16 MED ORDER — LIDOCAINE 2% (20 MG/ML) 5 ML SYRINGE
INTRAMUSCULAR | Status: AC
  Filled 2018-07-16: qty 5

## 2018-07-16 MED ORDER — ONDANSETRON HCL 4 MG/2ML IJ SOLN
INTRAMUSCULAR | Status: DC | PRN
  Administered 2018-07-16: 4 mg via INTRAVENOUS

## 2018-07-16 MED ORDER — DEXAMETHASONE SODIUM PHOSPHATE 4 MG/ML IJ SOLN
INTRAMUSCULAR | Status: DC | PRN
  Administered 2018-07-16: 10 mg via INTRAVENOUS

## 2018-07-16 MED ORDER — ONDANSETRON HCL 4 MG/2ML IJ SOLN
INTRAMUSCULAR | Status: AC
  Filled 2018-07-16: qty 2

## 2018-07-16 MED ORDER — CHLORHEXIDINE GLUCONATE 4 % EX LIQD: 60.0000 mL | Freq: Once | CUTANEOUS | Status: DC

## 2018-07-16 MED ORDER — METOCLOPRAMIDE HCL 5 MG/ML IJ SOLN: 10.0000 mg | Freq: Once | INTRAMUSCULAR | Status: DC | PRN

## 2018-07-16 MED ORDER — SCOPOLAMINE 1 MG/3DAYS TD PT72: 1.0000 | MEDICATED_PATCH | Freq: Once | TRANSDERMAL | Status: DC | PRN

## 2018-07-16 MED ORDER — BUPIVACAINE HCL (PF) 0.25 % IJ SOLN
INTRAMUSCULAR | Status: AC
  Filled 2018-07-16: qty 30

## 2018-07-16 MED ORDER — FENTANYL CITRATE (PF) 100 MCG/2ML IJ SOLN
25.0000 ug | INTRAMUSCULAR | Status: DC | PRN
  Administered 2018-07-16 (×2): 50 ug via INTRAVENOUS

## 2018-07-16 MED ORDER — EPINEPHRINE 30 MG/30ML IJ SOLN
INTRAMUSCULAR | Status: AC
  Filled 2018-07-16: qty 1

## 2018-07-16 MED ORDER — CEFAZOLIN SODIUM-DEXTROSE 2-4 GM/100ML-% IV SOLN
INTRAVENOUS | Status: AC
  Filled 2018-07-16: qty 100

## 2018-07-16 SURGICAL SUPPLY — 39 items
BANDAGE ACE 6X5 VEL STRL LF (GAUZE/BANDAGES/DRESSINGS) ×4 IMPLANT
BANDAGE ESMARK 6X9 LF (GAUZE/BANDAGES/DRESSINGS) IMPLANT
BLADE CLIPPER SURG (BLADE) IMPLANT
BNDG ESMARK 6X9 LF (GAUZE/BANDAGES/DRESSINGS)
CHLORAPREP W/TINT 26ML (MISCELLANEOUS) ×4 IMPLANT
CUFF TOURN SGL QUICK 42 (TOURNIQUET CUFF) ×4 IMPLANT
CUFF TOURNIQUET SINGLE 34IN LL (TOURNIQUET CUFF) IMPLANT
DISSECTOR 3.5MM X 13CM CVD (MISCELLANEOUS) ×4 IMPLANT
DISSECTOR 4.0MMX13CM CVD (MISCELLANEOUS) IMPLANT
DRAPE ARTHROSCOPY W/POUCH 90 (DRAPES) ×4 IMPLANT
DRAPE IMP U-DRAPE 54X76 (DRAPES) IMPLANT
DRAPE U-SHAPE 47X51 STRL (DRAPES) IMPLANT
GAUZE SPONGE 4X4 12PLY STRL (GAUZE/BANDAGES/DRESSINGS) IMPLANT
GAUZE XEROFORM 1X8 LF (GAUZE/BANDAGES/DRESSINGS) IMPLANT
GLOVE BIOGEL PI IND STRL 7.0 (GLOVE) ×4 IMPLANT
GLOVE BIOGEL PI IND STRL 8 (GLOVE) ×2 IMPLANT
GLOVE BIOGEL PI INDICATOR 7.0 (GLOVE) ×4
GLOVE BIOGEL PI INDICATOR 8 (GLOVE) ×2
GLOVE ECLIPSE 6.5 STRL STRAW (GLOVE) ×4 IMPLANT
GLOVE ECLIPSE 8.0 STRL XLNG CF (GLOVE) ×4 IMPLANT
GOWN STRL REUS W/ TWL LRG LVL3 (GOWN DISPOSABLE) ×2 IMPLANT
GOWN STRL REUS W/TWL LRG LVL3 (GOWN DISPOSABLE) ×2
GOWN STRL REUS W/TWL XL LVL3 (GOWN DISPOSABLE) ×4 IMPLANT
IV NS IRRIG 3000ML ARTHROMATIC (IV SOLUTION) ×4 IMPLANT
KIT TURNOVER KIT B (KITS) ×4 IMPLANT
MANIFOLD NEPTUNE II (INSTRUMENTS) IMPLANT
NDL SAFETY ECLIPSE 18X1.5 (NEEDLE) ×2 IMPLANT
NEEDLE HYPO 18GX1.5 SHARP (NEEDLE) ×2
NS IRRIG 1000ML POUR BTL (IV SOLUTION) IMPLANT
PACK ARTHROSCOPY DSU (CUSTOM PROCEDURE TRAY) ×4 IMPLANT
PAD ARMBOARD 7.5X6 YLW CONV (MISCELLANEOUS) IMPLANT
PADDING CAST COTTON 6X4 STRL (CAST SUPPLIES) IMPLANT
PROBE BIPOLAR ATHRO 135MM 90D (MISCELLANEOUS) IMPLANT
SUT MNCRL AB 3-0 PS2 18 (SUTURE) IMPLANT
SUT MNCRL AB 4-0 PS2 18 (SUTURE) ×4 IMPLANT
SYR 5ML LUER SLIP (SYRINGE) IMPLANT
TOWEL GREEN STERILE FF (TOWEL DISPOSABLE) ×4 IMPLANT
TUBING ARTHROSCOPY IRRIG 16FT (MISCELLANEOUS) ×4 IMPLANT
WATER STERILE IRR 1000ML POUR (IV SOLUTION) IMPLANT

## 2018-07-16 NOTE — Anesthesia Procedure Notes (Signed)
Procedure Name: LMA Insertion Performed by: Kalev Temme W, CRNA Pre-anesthesia Checklist: Patient identified, Emergency Drugs available, Suction available and Patient being monitored Patient Re-evaluated:Patient Re-evaluated prior to induction Oxygen Delivery Method: Circle system utilized Preoxygenation: Pre-oxygenation with 100% oxygen Induction Type: IV induction Ventilation: Mask ventilation without difficulty LMA: LMA inserted LMA Size: 4.0 Number of attempts: 1 Placement Confirmation: positive ETCO2 Tube secured with: Tape Dental Injury: Teeth and Oropharynx as per pre-operative assessment        

## 2018-07-16 NOTE — Transfer of Care (Signed)
Immediate Anesthesia Transfer of Care Note  Patient: Desiree Mckinney  Procedure(s) Performed: RIGHT ARTHROSCOPY KNEE CHONDROPLASTY, MEDIALMENISCECTOMY (Right Knee) CHONDROPLASTY (Right Knee)  Patient Location: PACU  Anesthesia Type:General  Level of Consciousness: awake and sedated  Airway & Oxygen Therapy: Patient Spontanous Breathing and Patient connected to face mask oxygen  Post-op Assessment: Report given to RN and Post -op Vital signs reviewed and stable  Post vital signs: Reviewed and stable  Last Vitals:  Vitals Value Taken Time  BP 117/66 07/16/2018  8:18 AM  Temp    Pulse 88 07/16/2018  8:19 AM  Resp 18 07/16/2018  8:19 AM  SpO2 100 % 07/16/2018  8:19 AM  Vitals shown include unvalidated device data.  Last Pain:  Vitals:   07/16/18 0634  TempSrc: Oral  PainSc: 3          Complications: No apparent anesthesia complications

## 2018-07-16 NOTE — H&P (Signed)
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 Symptoms are rated as moderate to severe, and have been worsening.  This is significantly impairing activities of daily living.  Please see my clinic note for full details on this patient's care.  She has elected for surgical management.   Past Medical History:  Diagnosis Date  . Anxiety   . Arthritis   . Diabetes (HCC)   . GERD (gastroesophageal reflux disease)   . Hyperlipidemia   . Hypertension   . Seasonal allergies    Past Surgical History:  Procedure Laterality Date  . ceasarean section  1993  . SPLENECTOMY  childhood   Social History   Socioeconomic History  . Marital status: Widowed    Spouse name: Not on file  . Number of children: Not on file  . Years of education: Not on file  . Highest education level: Not on file  Occupational History  . Not on file  Social Needs  . Financial resource strain: Not on file  . Food insecurity:    Worry: Not on file    Inability: Not on file  . Transportation needs:    Medical: Not on file    Non-medical: Not on file  Tobacco Use  . Smoking status: Former Smoker    Last attempt to quit: 09/16/1984    Years since quitting: 33.8  . Smokeless tobacco: Never Used  Substance and Sexual Activity  . Alcohol use: Yes    Alcohol/week: 0.0 standard drinks    Comment: occas  . Drug use: No  . Sexual activity: Not on file  Lifestyle  . Physical activity:    Days per week: Not on file    Minutes per session: Not on file  . Stress: Not on file  Relationships  . Social connections:    Talks on phone: Not on file    Gets together: Not on file    Attends religious service: Not on file    Active member of club or organization: Not on file    Attends meetings of clubs or organizations: Not on  file    Relationship status: Not on file  Other Topics Concern  . Not on file  Social History Narrative  . Not on file   Family History  Problem Relation Age of Onset  . Colon cancer Neg Hx    Allergies  Allergen Reactions  . Aspirin Palpitations   Prior to Admission medications   Medication Sig Start Date End Date Taking? Authorizing Provider  atenolol (TENORMIN) 25 MG tablet Take by mouth daily.   Yes [provider]  clonazePAM (KLONOPIN) 0.5 MG tablet Take 0.5 mg 2 (two) times daily by mouth.   Yes [provider]  ibuprofen (ADVIL,MOTRIN) 600 MG tablet Take 600 mg by mouth every 6 (six) hours as needed.   Yes [provider]  lisinopril (PRINIVIL,ZESTRIL) 10 MG tablet Take 10 mg by mouth daily.   Yes [provider]  loratadine (CLARITIN) 10 MG tablet Take 10 mg by mouth daily.   Yes [provider]  meloxicam (MOBIC) 15 MG tablet Take 1 tablet (15 mg total) by mouth daily. 05/11/18  Yes Eustace Moore, MD  metFORMIN (GLUCOPHAGE) 500 MG tablet Take 500 mg by mouth 2 (two) times daily with a meal.   Yes  [provider]  omeprazole (PRILOSEC) 20 MG capsule Take 20 mg by mouth daily.   Yes [provider]  pravastatin (PRAVACHOL) 20 MG tablet Take 20 mg by mouth daily.   Yes [provider]  methocarbamol (ROBAXIN) 500 MG tablet Take 1,000 mg 3 (three) times daily by mouth.    [provider]  polyethylene glycol (MIRALAX / GLYCOLAX) packet Take 17 g by mouth once. One time use for colonoscopy    [provider]     Positive ROS: All other systems have been reviewed and were otherwise negative with the exception of those mentioned in the HPI and as above.  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   Bjorn Pippin, MD  07/16/2018 7:18 AM

## 2018-07-16 NOTE — Anesthesia Postprocedure Evaluation (Signed)
Anesthesia Post Note  Patient: Desiree Mckinney  Procedure(s) Performed: RIGHT ARTHROSCOPY KNEE CHONDROPLASTY, MEDIALMENISCECTOMY (Right Knee) CHONDROPLASTY (Right Knee)     Patient location during evaluation: PACU Anesthesia Type: General Level of consciousness: awake and alert and oriented Pain management: pain level controlled Vital Signs Assessment: post-procedure vital signs reviewed and stable Respiratory status: spontaneous breathing, nonlabored ventilation and respiratory function stable Cardiovascular status: blood pressure returned to baseline and stable Postop Assessment: no apparent nausea or vomiting Anesthetic complications: no    Last Vitals:  Vitals:   07/16/18 0845 07/16/18 0900  BP: 135/83 125/62  Pulse: 75 66  Resp: 17 12  Temp:    SpO2: 98% 98%    Last Pain:  Vitals:   07/16/18 0845  TempSrc:   PainSc: 5                  Chuckie Mccathern A.

## 2018-07-16 NOTE — Discharge Instructions (Signed)

## 2018-07-16 NOTE — Op Note (Signed)
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    Post-operative plan: The patient will be WBAT.  The patient will be dc home.  DVT prophylaxis discussed with family that intolerance of aspirin was mild and perhaps linked to [facility] powder and caffeine.  Plan for ASA 81mg  qd in low risk patient versus supportive measures like early ambulation and ted hose.  Pain control with PRN pain medication preferring oral medicines.  Follow up plan will be scheduled in approximately 7 days for incision check .  Post-Op Diagnosis: Same Surgeons:Primary: Bjorn Pippin, MD Assistants: Location: [facility] OR ROOM 6 Anesthesia: General Antibiotics: Ancef 2g preop Tourniquet time: none Estimated Blood Loss: minimal Complications: None Specimens: None Implants: * No implants in log *  Indications for Surgery:   Desiree Mckinney is a 57 y.o. female with R medial knee pain refractory to non- op management with NSAIDs, HEP, injections.  We specifically went over with the patient that her sharp pain with her primary goal to treat with her surgery rather than arthritic type pains.  She understood this and like to proceed.  She understood she may continue to have some pain from arthritis.  Benefits and risks of operative and nonoperative management were discussed prior to surgery with patient/guardian(s) and informed consent form was completed.  Specific risks including infection, need for additional surgery, continued pain due to arthritis, loose body formation, stiffness   Procedure:   The patient was identified in the preoperative holding area where the surgical site was marked. The patient was taken to the OR where a  procedural timeout was called and the above noted anesthesia was induced.  The patient was positioned supine on the with a knee holder.  Preoperative antibiotics were dosed.  The patient's right knee was prepped and draped in the usual sterile fashion.  A second preoperative timeout was called.      Exam under anesthesia: Range of motion full and symmetric to opposite knee, ligamentously stable exam with normal lachman  Suprapatellar pouch:patellofemoral G3 and 4 changes center of trochlea and medial facet patella  Medial compartment: 4x5 cm grade 3 changes on femur engaging from 30-90 degrees, grade 3 changes diffusely on tibia.  Degenerative appearing mensical tear posteromedially with fraying, 20% total meniscal volume resected  Lateral Compartment: Grade 2 changes tibia, intact femur.  Lateral meniscus anterior horn confluent with ACL  Intercondylar Notch: Lateral meniscus anterior horn confluent with ACL.    The patient was identified properly. Informed consent was obtained and the surgical site was marked. The patient was taken up to suite where general anesthesia was induced. The patient was placed in the supine position with a post against the surgical leg and a nonsterile tourniquet applied. The surgical leg was then prepped and draped usual sterile fashion.  A standard surgical timeout was performed.  2 standard anterior portals were made and diagnostic arthroscopy performed. Please note the findings as noted above.  Gentle chondroplasty was performed with a shaver to the patella, trochlea and femoral medial condyle arthritic areas with loose cartilage taken back to a stable base.  The meniscus tear in the posterior medial aspect of the knee was resected back to a stable base using a shaver as well as arthroscopic baskets.  Incisions closed  with absorbable suture. The patient was awoken from general anesthesia and taken to the PACU in stable condition without complication.

## 2018-07-17 ENCOUNTER — Encounter (HOSPITAL_BASED_OUTPATIENT_CLINIC_OR_DEPARTMENT_OTHER): Payer: Self-pay | Admitting: Orthopaedic Surgery

## 2019-11-08 ENCOUNTER — Ambulatory Visit (INDEPENDENT_AMBULATORY_CARE_PROVIDER_SITE_OTHER): Payer: BC Managed Care – PPO | Admitting: Neurology

## 2019-11-08 ENCOUNTER — Encounter: Payer: Self-pay | Admitting: Neurology

## 2019-11-08 ENCOUNTER — Other Ambulatory Visit: Payer: Self-pay

## 2019-11-08 VITALS — BP 127/72 | HR 91 | Temp 96.5°F | Ht 65.0 in | Wt 206.0 lb

## 2019-11-08 DIAGNOSIS — M26653 Arthropathy of bilateral temporomandibular joint: Secondary | ICD-10-CM | POA: Diagnosis not present

## 2019-11-08 DIAGNOSIS — R0683 Snoring: Secondary | ICD-10-CM

## 2019-11-08 DIAGNOSIS — F4310 Post-traumatic stress disorder, unspecified: Secondary | ICD-10-CM

## 2019-11-08 DIAGNOSIS — F458 Other somatoform disorders: Secondary | ICD-10-CM | POA: Diagnosis not present

## 2019-11-08 DIAGNOSIS — F5104 Psychophysiologic insomnia: Secondary | ICD-10-CM | POA: Diagnosis not present

## 2019-11-08 NOTE — Progress Notes (Signed)
SLEEP MEDICINE CLINIC    Provider:  Melvyn Novas, MD  Primary Care Physician:  Grayce Sessions, NP [id number] [place] [address]     Referring Provider:  Dr. Irene Limbo, DDS          Chief Complaint according to patient   Patient presents with:    . New Patient (Initial Visit)           HISTORY OF PRESENT ILLNESS:  Desiree Mckinney is a 42. year old [place] American female patient  and is seen upon referral  by Dr Toni Arthurs, DDS on 11/08/2019 .  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  She  has a past medical history of Anxiety, Arthritis, Diabetes (HCC), GERD (gastroesophageal reflux disease), Hyperlipidemia, Hypertension, and Seasonal allergies..  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  There is also a movement deviation and opening and closing of the mandible Dr. Toni Arthurs already began treatment in an attempt to avoid surgery visit daytime and nighttime splint of which the patient has made use for the last 14 days so it is a rather new finding.  This could explains meant to assist with stretching and with closure of the temporal nerves TMJ.  There is a possible concern about sleep disordered breathing as Desiree Mckinney Mallampati is high-grade -she has a rather small upper airway opening and some retrognathia.     Family medical /sleep history: insomnia widely  present , no other family member on CPAP with OSA. Father has DM, HTN, CAD, and Cancer, mother had heart disease, too.    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 HS graduate.   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  She  lives in a household with 6 persons. Family status is widowed  , with 3 now adult  children, she loves with father, sister, daughter and her two grandchildren 39, and 7 with ADHD.  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  The patient currently works used to work in shifts( Chief Technology Officer,) Pets _ dog and cat present. Tobacco use: never .  ETOH use : socially- rare ,  Caffeine intake in form of Coffee( decaffeinated ) Soda( decaffeinated ) Tea ( decaffeinated ) - no  energy drinks. Regular exercise paused for now.  Hobbies: staying hoe taking care.       Sleep habits are as follows: The patient's dinner time is between 6 PM. The patient goes to bed at 9.30 PM and reads in her room, TV , struggles to sleep- once asleep  continues to sleep for 2 hours, wakes for unknown reasons, but uses these to go on bathroom breaks, the first time at midnight .Marland Kitchen   The preferred sleep position is on her right side, with the support of 5  pillows.  Dreams are reportedly frequent, sometimes vividly.  5.45   AM is the usual rise time. The patient wakes up spontaneously at 5. 21 - every morning, prepares daughter's and grand kids breakfast. She has some break at 11 AM,    She reports not feeling refreshed or restored in AM, with symptoms such as morning headaches, and residual fatigue. Naps are taken daily- 10.30 Am to 11.00 AM , power nap  lasting from 15-30  minutes and are more refreshing than nocturnal sleep.    Review of Systems: Out of a complete 14 system review, the patient complains of only the following symptoms, and all other reviewed systems are negative.:  Fatigue, sleepiness , snoring, fragmented sleep, Insomnia -  lifelong, TMJ, temporal head pain.    How likely are you to doze in the following situations: 0 = not likely, 1 = slight chance, 2 = moderate chance, 3 = high chance   Sitting and Reading? Watching Television? Sitting inactive in a public place (theater or meeting)? As a passenger in a car for an hour without a break? Lying down in the afternoon when circumstances permit? Sitting and talking to someone? Sitting quietly after lunch without alcohol? In a car, while stopped for a few minutes in traffic?   Total = 6/ 24 points   FSS endorsed at 37/ 63 points.   Social History   Socioeconomic History  . Marital status: Widowed    Spouse name: Not on file  . Number of children: Not on file  . Years of education: Not on file  . Highest education level: Not on file  Occupational History  . Not on file  Tobacco Use  . Smoking status: Former Smoker    Quit date: 09/16/1984    Years since quitting: 35.1  . Smokeless tobacco: Never Used  Substance and Sexual Activity  . Alcohol use: Yes    Alcohol/week: 0.0 standard drinks    Comment: occas  . Drug use: No  . Sexual activity: Not on file  Other Topics Concern  . Not on file  Social History Narrative  . Not on file   Social Determinants of Health   Financial Resource Strain:   . Difficulty of Paying Living Expenses: Not on file  Food Insecurity:   . Worried About Charity fundraiser in the Last Year: Not on file  . Ran Out of Food in the Last Year: Not on file  Transportation Needs:   . Lack of Transportation (Medical): Not on file  . Lack of Transportation (Non-Medical): Not on file  Physical Activity:   . Days of Exercise per Week: Not on file  . Minutes of Exercise per Session: Not on file  Stress:   . Feeling of Stress : Not on file  Social Connections:   . Frequency of Communication with Friends and Family: Not on file  . Frequency of Social Gatherings with Friends and Family: Not on file  . Attends Religious  Services: Not on file  . Active Member of Clubs or Organizations: Not on file  . Attends Archivist Meetings: Not on file  . Marital Status: Not on file    Family History  Problem Relation Age of Onset  . Colon cancer Neg Hx     Past Medical History:  Diagnosis Date  . Anxiety   . Arthritis   . Diabetes ([place])   . GERD (gastroesophageal reflux disease)   . Hyperlipidemia   . Hypertension   .  Seasonal allergies     Past Surgical History:  Procedure Laterality Date  . ceasarean section  1993  . CHONDROPLASTY Right 07/16/2018   Procedure: CHONDROPLASTY;  Surgeon: Bjorn Pippin, MD;  Location: [facility];  Service: Orthopedics;  Laterality: Right;  . KNEE ARTHROSCOPY WITH MEDIAL MENISECTOMY Right 07/16/2018   Procedure: RIGHT ARTHROSCOPY KNEE CHONDROPLASTY, MEDIALMENISCECTOMY;  Surgeon: Bjorn Pippin, MD;  Location: [facility];  Service: Orthopedics;  Laterality: Right;  . SPLENECTOMY  childhood     Current Outpatient Medications on File Prior to Visit  Medication Sig Dispense Refill  . atenolol (TENORMIN) 25 MG tablet Take by mouth daily.    . clonazePAM (KLONOPIN) 0.5 MG tablet Take 0.5 mg 2 (two) times daily by mouth.    Marland Kitchen ibuprofen (ADVIL,MOTRIN) 600 MG tablet Take 600 mg by mouth every 6 (six) hours as needed.    Marland Kitchen lisinopril (PRINIVIL,ZESTRIL) 10 MG tablet Take 10 mg by mouth daily.    Marland Kitchen loratadine (CLARITIN) 10 MG tablet Take 10 mg by mouth daily.    . metFORMIN (GLUCOPHAGE) 500 MG tablet Take 500 mg by mouth 2 (two) times daily with a meal.    . methocarbamol (ROBAXIN) 500 MG tablet Take 1,000 mg 3 (three) times daily by mouth.    Marland Kitchen omeprazole (PRILOSEC) 20 MG capsule Take 20 mg by mouth daily.    . pravastatin (PRAVACHOL) 20 MG tablet Take 20 mg by mouth daily.     No current facility-administered medications on file prior to visit.    Allergies  Allergen Reactions  . Aspirin Palpitations    Physical exam:  Today's  Vitals   11/08/19 1259  BP: 127/72  Pulse: 91  Temp: (!) 96.5 F (35.8 C)  Weight: 206 lb (93.4 kg)  Height: 5\' 5"  (1.651 m)   Body mass index is 34.28 kg/m.   Wt Readings from Last 3 Encounters:  11/08/19 206 lb (93.4 kg)  07/16/18 208 lb 12.4 oz (94.7 kg)  05/18/15 213 lb 6.4 oz (96.8 kg)     Ht Readings from Last 3 Encounters:  11/08/19 5\' 5"  (1.651 m)  07/16/18 5\' 5"  (1.651 m)  05/18/15 5\' 5"  (1.651 m)      General: The patient is awake, alert and appears not in acute distress. The patient is well groomed. Head: Normocephalic, atraumatic. Neck is supple. Mallampati 4,  neck circumference: 15 inches . Nasal airflow patent.  Retrognathia is only very mild.  Dental status:  Affected- see Dr 07/18/18 note.  Cardiovascular:  Regular rate and cardiac rhythm by pulse,  without distended neck veins. Respiratory: Lungs are clear. Skin:  No evidence of ankle edema, or rash. Trunk: The patient's posture is erect.   Neurologic exam : The patient is awake and alert, oriented to place and time.   Memory subjective described as intact.  Attention span & concentration ability appears normal.  Speech is fluent,  without  dysarthria, dysphonia or aphasia.  Mood and affect are appropriate.   Cranial nerves: no loss of smell or taste reported  Pupils are equal and briskly reactive to light. Funduscopic exam deferred. Extraocular movements in vertical and horizontal planes were intact and without nystagmus. No Diplopia. Visual fields by finger perimetry are intact. Hearing was intact to soft voice and finger rubbing. Facial sensation intact to fine touch. Facial motor strength is symmetric and tongue and uvula move midline.  Neck ROM : rotation, tilt and flexion extension were normal for age and shoulder  shrug was symmetrical. Motor exam:  Symmetric bulk, tone and ROM.   Normal tone without cog wheeling, symmetric grip strength loss- notable atrophied thenar eminence . Sensory:  Fine  touch, pinprick and vibration were normal.  Proprioception tested in the upper extremities was normal Coordination: Rapid alternating movements in the fingers/hands were of normal speed. No changes in penman- ship reported.  The Finger-to-nose maneuver was intact without evidence of ataxia, dysmetria or tremor. Gait and station: Patient could rise unassisted from a seated position, walked without assistive device.  Stance is of normal width/ base and the patient was observed turned with 3 steps.  Toe and heel walk were deferred.  Deep tendon reflexes: in the  upper and lower extremities are symmetric and intact.  Babinski response was deferred .     After spending a total time of 40 minutes face to face and additional time for physical and neurologic examination, review of laboratory studies,  personal review of imaging studies, reports and results of other testing and review of referral information / records as far as provided in visit, I have established the following assessments:  1)  The patient reports no symptoms of apnea, no gasping, she snores lifelong.   To screen for OSA a HST may be sufficient, but an attended sleep study will give [place] information of Bruxism.  Bruxism can be an equivalent of PLMs in NREM sleep.      My Plan is to proceed with:  1) I will order both types of study, BCBS may allow in- lab- study.    I would like to thank Dr Irene Limbo, DDS,  for allowing me to meet with and to take care of this pleasant patient.   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    I plan to follow up either personally or through our NP within 2-3  month.   CC: I will share my notes with PCP.   Electronically signed by: Melvyn Novas, MD 11/08/2019 1:12 PM  [facility] Board certified by The [employer] of Sleep Medicine and Diplomate of  the [employer] of Sleep Medicine. Board certified In Neurology through the ABPN, Fellow of the [employer] of Neu[employer]. Medical Director of [facility].

## 2019-11-08 NOTE — Patient Instructions (Signed)

## 2019-11-22 IMAGING — MR MR KNEE*R* W/O CM
4 of 6 series · 24 of 40 positions shown · non-contrast
Comparison: None.

CLINICAL DATA: Knee pain for 2 months.

EXAM:
MRI OF THE RIGHT KNEE WITHOUT CONTRAST
TECHNIQUE: Multiplanar, multisequence MR imaging of the knee was performed. No
intravenous contrast was administered.

[Series 4: T1 · coronal · 4.0mm · 0.29mm/px · 5 of 29 slices shown]
[im 1/29]
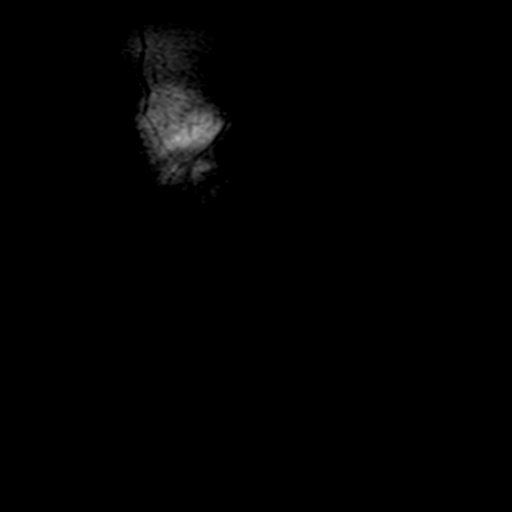
[im 5/29]
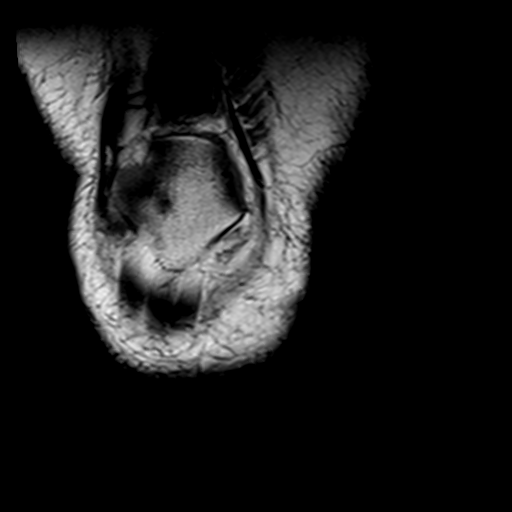
[im 10/29]
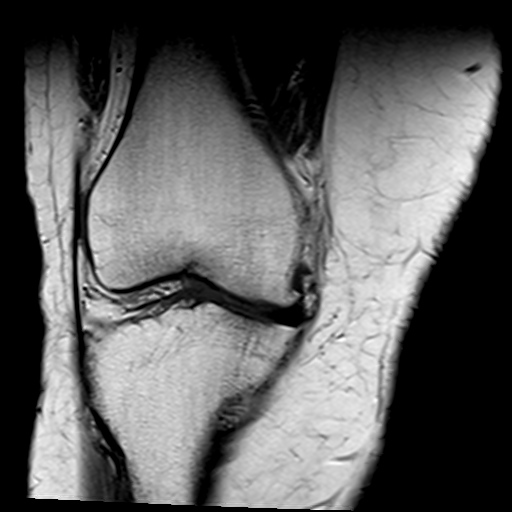
[im 15/29]
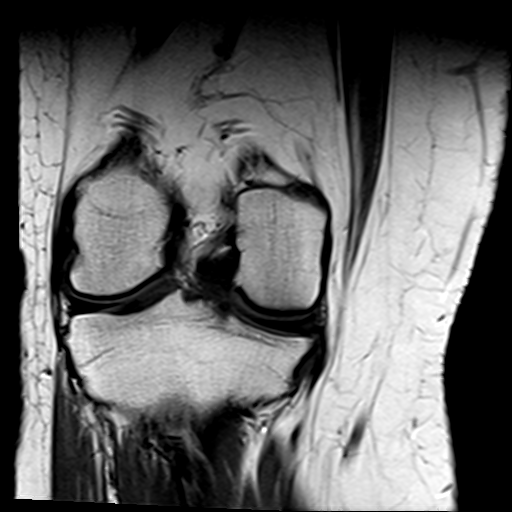
[im 24/29]
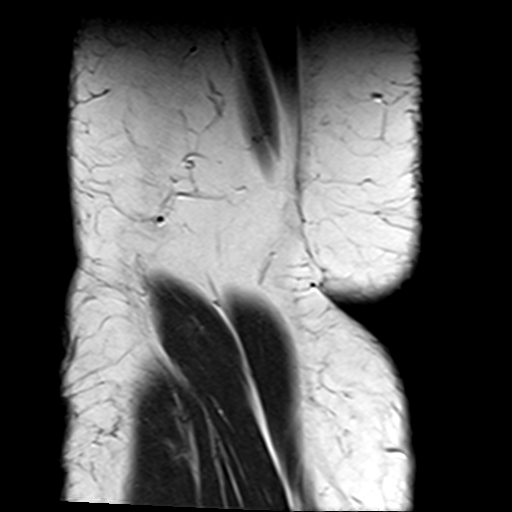

[Series 5: T2 fat-sat · coronal · 4.0mm · 0.59mm/px · 7 of 29 slices shown (1 of 2)]
[im 1/29]
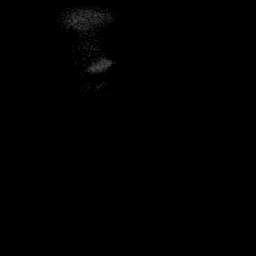
[im 5/29]
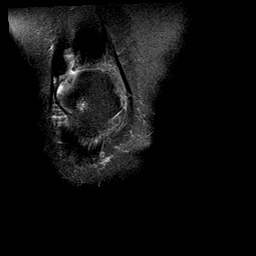
[im 10/29]
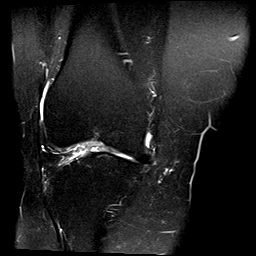
[im 15/29]
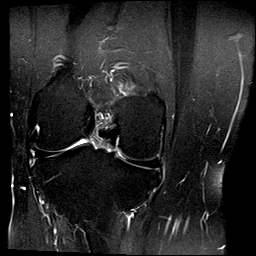
[im 19/29]
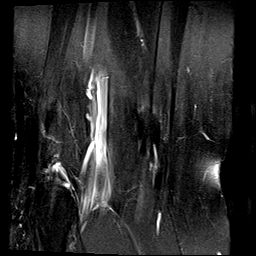
[im 24/29]
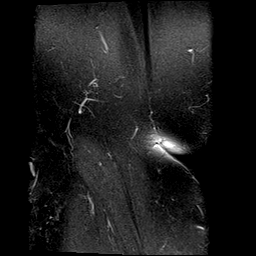
[im 29/29]
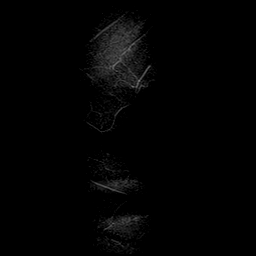

[Series 7: PD fat-sat · sagittal · 3.0mm · 0.29mm/px · 6 of 24 slices shown]
[im 1/24]
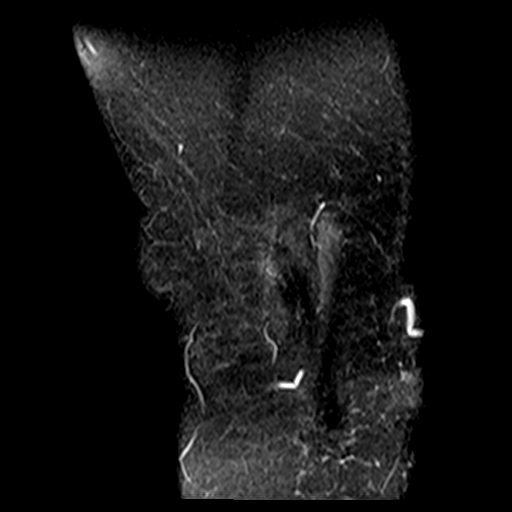
[im 5/24]
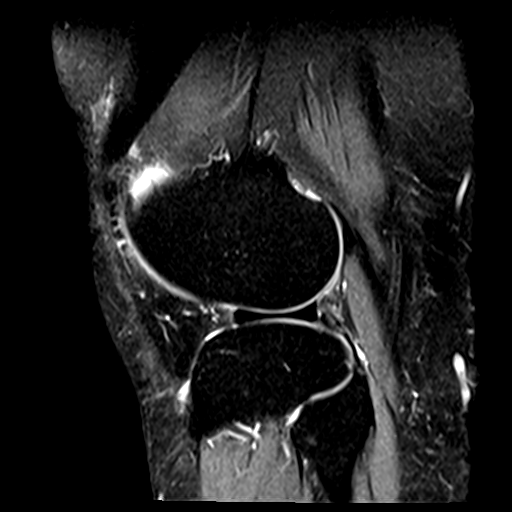
[im 10/24]
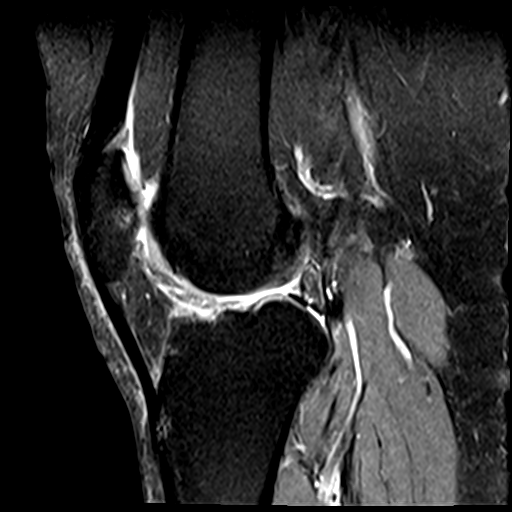
[im 14/24]
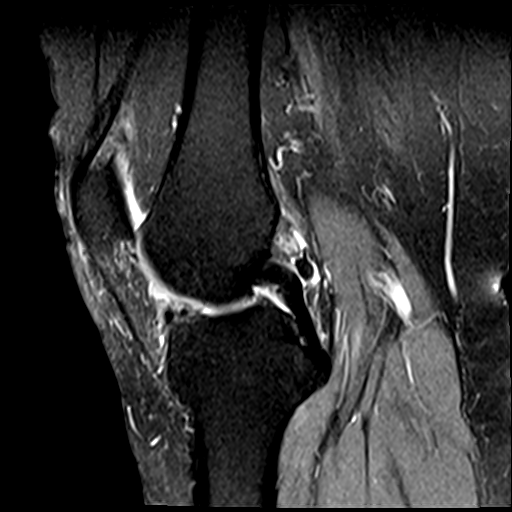
[im 19/24]
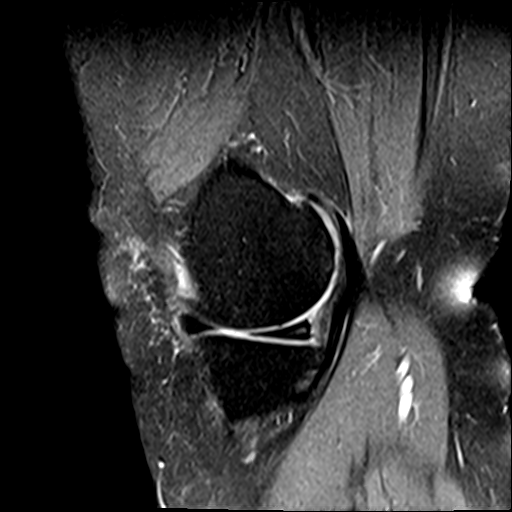
[im 24/24]
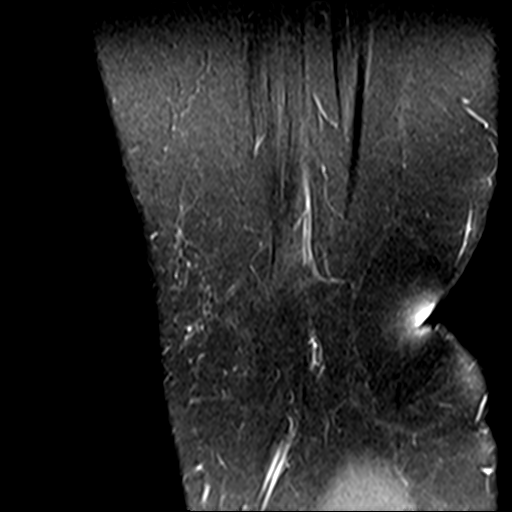

[Series 8: T2 fat-sat · sagittal · 3.0mm · 0.29mm/px · 6 of 24 slices shown (2 of 2)]
[im 1/24]
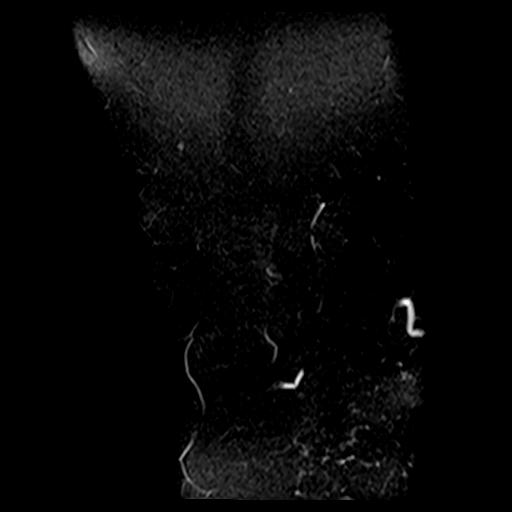
[im 5/24]
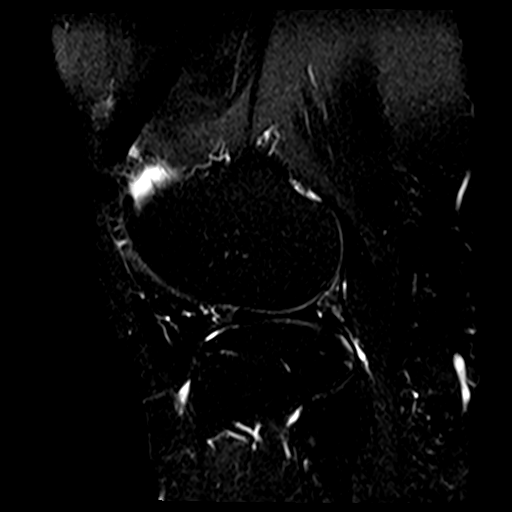
[im 10/24]
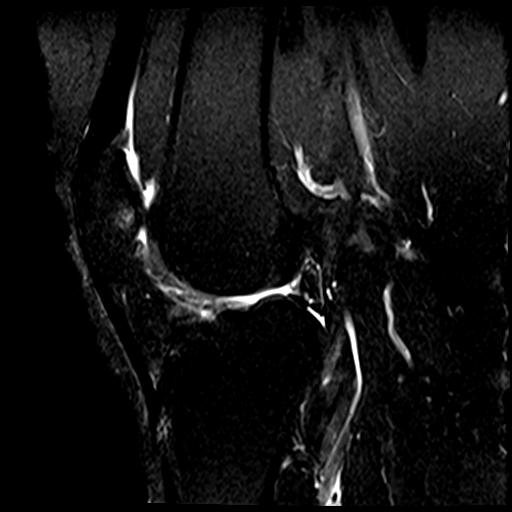
[im 14/24]
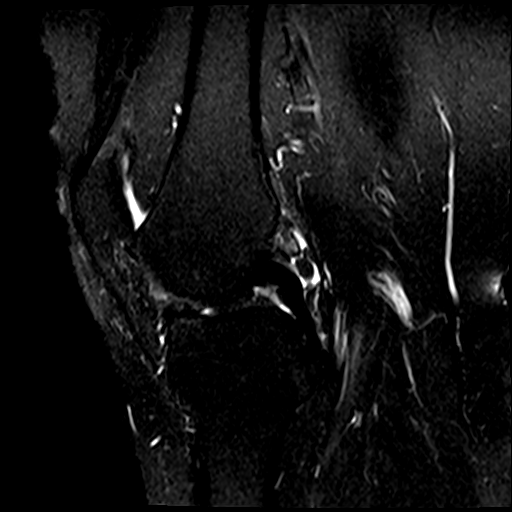
[im 19/24]
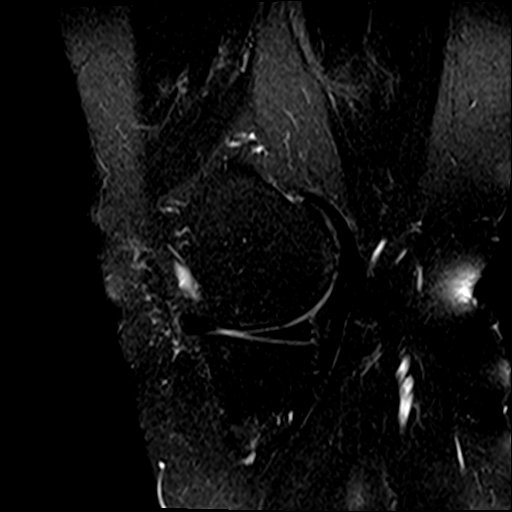
[im 24/24]
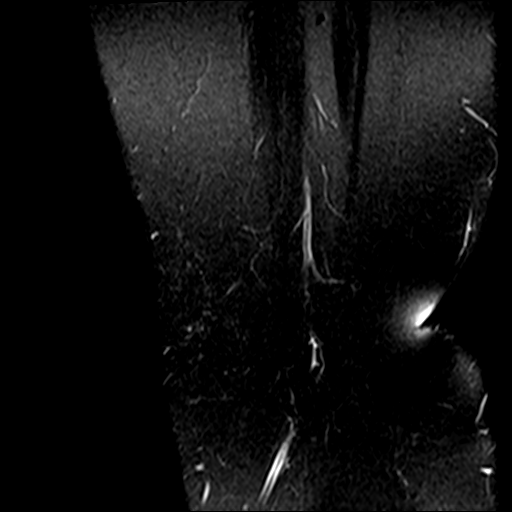

[24 of 40 positions shown; findings below may reference images not displayed]

FINDINGS: MENISCI

Medial meniscus: Shallow oblique coursing inferior articular surface
tear at the posterior horn mid body junction region. There are also
degenerative changes involving the anterior horn with degenerative
fraying and fibrillation and some surrounding synovitis.

Lateral meniscus:  Intact

LIGAMENTS

Cruciates:  Intact

Collaterals:  Intact

CARTILAGE

Patellofemoral: Moderate degenerative chondrosis with moderate
cartilage thinning, fraying, fissuring and fibrillation. This is
most significant along the medial facet and medial aspect of the
patellar apex. There are also subchondral cystic changes.

Medial: Moderate degenerative chondrosis mainly involving the medial
femoral condyle articular cartilage with associated areas of
subchondral cystic change. Early joint space narrowing and spurring.

Lateral:  Mild degenerative chondrosis.

Joint:  Small joint effusion.

Popliteal Fossa:  No popliteal mass or Baker's cyst.

Extensor Mechanism: The patella retinacular structures are intact
and the quadriceps and patellar tendons are intact. No quadriceps or
patellar tendinopathy.

Bones:  No acute bony findings.

Other: Normal knee musculature.
IMPRESSION: 1. Shallow oblique coursing inferior articular surface tear
involving the medial meniscus.
2. Tricompartmental degenerative changes most significant in the
patellofemoral joint.
3. Intact ligamentous structures and no acute bony findings.

## 2019-11-24 ENCOUNTER — Ambulatory Visit (INDEPENDENT_AMBULATORY_CARE_PROVIDER_SITE_OTHER): Payer: BC Managed Care – PPO | Admitting: Neurology

## 2019-11-24 DIAGNOSIS — M26653 Arthropathy of bilateral temporomandibular joint: Secondary | ICD-10-CM

## 2019-11-24 DIAGNOSIS — F5104 Psychophysiologic insomnia: Secondary | ICD-10-CM

## 2019-11-24 DIAGNOSIS — G4733 Obstructive sleep apnea (adult) (pediatric): Secondary | ICD-10-CM

## 2019-11-24 DIAGNOSIS — R0683 Snoring: Secondary | ICD-10-CM

## 2019-11-24 DIAGNOSIS — F458 Other somatoform disorders: Secondary | ICD-10-CM

## 2019-12-01 DIAGNOSIS — G4733 Obstructive sleep apnea (adult) (pediatric): Secondary | ICD-10-CM | POA: Insufficient documentation

## 2019-12-01 NOTE — Procedures (Signed)
  Patient Information     First Name: Mizani Last Name: Riva ID: [id number]  Birth Date: Feb 07, 1961 Age: 59 Gender: Female  Referring Provider: Dr. Irene Limbo, DDS BMI: 34.5 (W=207 lb, H=5' 5'')  Neck Circ.:  15 '' Epworth:  6/24   Sleep Study Information    Study Date: Nov 24, 2019 S/H/A Version: [id number] / 4.1.1528 / 77  History:    11-08-2019. Moani Weipert is a right-handed [place] American female with a TMJ disorder using a day and nighttime oral splint, prepared by Dr. Irene Limbo. Question if she also has OSA let to this HST referral  She has a past medical history of Anxiety, Arthritis, Diabetes (HCC), GERD (gastroesophageal reflux disease) and is obese at BMI 34.5, with Hyperlipidemia, Hypertension, and Seasonal allergies. " I don't think I have ever slept all night"- " even as a baby"       Summary & Diagnosis:    Mild OSA (Obstructive Sleep Apnea) but with a strong REM sleep dependence- The overall AHI was 13.3/h and the REM  AHI was 24.9 /h.  This constellation was not as much position dependent and not associated with hypoxemia or tachy-bradycardia.   Recommendations:      CPAP therapy can address REM dependent OSA where dental devices usually fail. The patient can benefit from positive airway pressure therapy. I will order a CPAP setting of 5-15 cm water, 2 cm EPR, heated humidity and a mask of her choosing.   Interpreting Physician: Melvyn Novas, MD             Sleep Summary  Oxygen Saturation Statistics   Start Study Time: End Study Time: Total Recording Time:  8:10:50 PM 4:55:01 AM 8 h, 44 min  Total Sleep Time % REM of Sleep Time:  5 h, 59 min  34.5    Mean: 94 Minimum: 62 Maximum: 99  Mean of Desaturations Nadirs (%):   90  Oxygen Desaturation. %:   4-9 10-20 >20 Total  Events Number Total    22  2 88.0 8.0  1 4.0  25 100.0  Oxygen Saturation: <90 <=88 <85 <80 <70  Duration (minutes): Sleep % 0.1 0.0  0.1 0.1  0.0 0.0  0.1 0.0 0.1 0.0     Respiratory Indices      Total Events REM NREM All Night  pRDI:  77  pAHI:  76 ODI:  25  pAHIc:  0  % CSR: 0.0 25.4 24.9 6.6 0.0 7.2 7.2 3.2 0.0 13.5 13.3 4.4 0.0       Pulse Rate Statistics during Sleep (BPM)      Mean: 67 Minimum: 51 Maximum: 105    Indices are calculated using technically valid sleep time of 5 h, 43 min. Central-Indices are calculated using technically valid sleep time of  4  h 57 min. pRDI/pAHI are calculated using oxi desaturations ? 3%  Body Position Statistics  Position Supine Prone Right Left Non-Supine  Sleep (min) 157.0 0.0 12.2 190.0 202.2  Sleep % 43.7 0.0 3.4 52.9 56.3  pRDI 14.0 N/A 5.3 13.5 13.1  pAHI 14.0 N/A 5.3 13.2 12.8  ODI 6.6 N/A 5.3 2.6 2.7     Snoring Statistics Snoring Level (dB) >40 >50 >60 >70 >80 >Threshold (45)  Sleep (min) 186.6 8.2 1.5 0.0 0.0 50.2  Sleep % 51.9 2.3 0.4 0.0 0.0 14.0    Mean: 42 dB

## 2019-12-01 NOTE — Progress Notes (Signed)
Desiree Mckinney, please send / fax this note to Dr Irene Limbo, DDS.   Summary & Diagnosis:   Mild OSA (Obstructive Sleep Apnea) but with a strong REM sleep  dependence- The overall AHI was 13.3/h and the REM AHI was 24.9  /h.  This constellation was not as much position dependent and not  associated with hypoxemia or tachy-bradycardia.   Recommendations:    CPAP therapy can address REM dependent OSA where dental devices  usually fail. The patient can benefit from positive airway  pressure therapy. I will order a CPAP setting of 5-15 cm water, 2  cm EPR, heated humidity and a mask of her choosing.   Interpreting Physician: Melvyn Novas, MD

## 2019-12-01 NOTE — Addendum Note (Signed)
Addended by: Melvyn Novas on: 12/01/2019 05:26 PM   Modules accepted: Orders

## 2019-12-02 ENCOUNTER — Telehealth: Payer: Self-pay | Admitting: Neurology

## 2019-12-02 NOTE — Telephone Encounter (Signed)
-----   Message from Melvyn Novas, MD sent at 12/01/2019  5:26 PM EDT ----- Baird Lyons, please send / fax this note to Dr Irene Limbo, DDS.   Summary & Diagnosis:   Mild OSA (Obstructive Sleep Apnea) but with a strong REM sleep  dependence- The overall AHI was 13.3/h and the REM AHI was 24.9  /h.  This constellation was not as much position dependent and not  associated with hypoxemia or tachy-bradycardia.   Recommendations:    CPAP therapy can address REM dependent OSA where dental devices  usually fail. The patient can benefit from positive airway  pressure therapy. I will order a CPAP setting of 5-15 cm water, 2  cm EPR, heated humidity and a mask of her choosing.   Interpreting Physician: Melvyn Novas, MD

## 2019-12-02 NOTE — Telephone Encounter (Signed)
Called the patient and reviewed her sleep study results. The patient completed the HST without using the dental device that Dr Toni Arthurs provided. I reviewed what the baseline HST showed in regards to mild sleep apnea. Before she goes the auto CPAP route she wanted She is questioning if this needs to be done also with the device to compare. I advised I would bring this to Dr Dohmeier's attention and see if she would like to move forward with that option. I told her if so the sleep lab will contact her to get her scheduled for that. Otherwise I would call her back.

## 2019-12-20 ENCOUNTER — Telehealth: Payer: Self-pay | Admitting: Neurology

## 2019-12-20 NOTE — Telephone Encounter (Signed)
  Patient Information     First Name: Desiree Last Name: Mckinney ID: [id number]  Birth Date: 03/27/1961 Age: 59 Gender: Female  Referring Provider: Grayce Sessions, NP BMI: 34.5 (W=207 lb, H=5' 5'')  Neck Circ.:  15 '' Epworth:  6/24 Mobile Phone:  Sleep Study Information    Study Date: Dec 08, 2019 S/H/A Version: 003.003.003.003 / 4.1.1528 / 50  History:    Kloie Whiting is a right-handed [place] or [place] American female with a possible TMJ or OSA related sleep disorder.   She has a medical history of Anxiety, Arthritis, Diabetes (HCC), GERD (gastroesophageal reflux disease), Hyperlipidemia, Hypertension, and Seasonal allergies. " I don't think I have ever slept all night- even as a baby"         Summary & Diagnosis:     Very mild sleep apnea was identified at an AHI of only 5.9/h - REM accentuated to 14.9/h. There was mild- moderate snoring, and no hypoxemia was noted. Caveat- the snoring channel can be activated by bruxism.    Recommendations:      The patient may benefit from CPAP treatment, but not from dental device use.  If Mrs. Seeman is willing to try PAP therapy, I will provide an autotitration CPAP for her, 5-12 cm water with 2 cm EPR.   Interpreting Physician: Melvyn Novas, MD            Sleep Summary  Oxygen Saturation Statistics   Start Study Time: End Study Time: Total Recording Time:  8:08:55 PM 5:33:38 AM   9 h, 24 min  Total Sleep Time % REM of Sleep Time:  6 h, 59 min  29.3    Mean: 94 Minimum: 89 Maximum: 98  Mean of Desaturations Nadirs (%):   92  Oxygen Desaturation. %:   4-9 10-20 >20 Total  Events Number Total    13  1 92.9 7.1  0 0.0  14 100.0  Oxygen Saturation: <90 <=88 <85 <80 <70  Duration (minutes): Sleep % 0.0 0.0  0.0 0.0  0.0 0.0 0.0 0.0 0.0 0.0     Respiratory Indices      Total Events REM NREM All Night  pRDI:  52  pAHI:  41 ODI:  14  pAHIc:  0  % CSR: 0.0 16.8 14.9 5.5 0.0 3.7 2.2 0.6 0.0  7.5 5.9 2.0 0.0       Pulse Rate Statistics during Sleep (BPM)      Mean: 65 Minimum: 38 Maximum:  98    Indices are calculated using technically valid sleep time of 6 h, 57 min. Central-Indices are calculated using technically valid sleep time of 6 h, 39 min. pRDI/pAHI are calculated using oxi desaturations ? 3%   Body Position Statistics  Position Supine Prone Right Left Non-Supine  Sleep (min) 189.7 149.0 0.0 80.5 229.5  Sleep % 45.3 35.5 0.0 19.2 54.7  pRDI 10.2 3.6 N/A 8.2 5.2  pAHI 8.3 2.8 N/A 6.0 3.9  ODI 2.9 0.4 N/A 3.0 1.3     Snoring Statistics Snoring Level (dB) >40 >50 >60 >70 >80 >Threshold (45)  Sleep (min) 49.6 2.1 0.3 0.0 0.0 3.9  Sleep % 11.8 0.5 0.1 0.0 0.0 0.9    Mean: 40 dB

## 2019-12-22 NOTE — Telephone Encounter (Signed)
Called the patient and was able to review the SSR. Informed her that based off this study which was completed with the dental device, her apnea was treated fairly well. AHI decreased from 13.3 to 5.9. informed her that REM apnea is likely to remain but it was decreased with using the dental device. Advised the patient to continue using the dental device. Advised that if she still has concerns/complaints of daytime fatigue things like that then she would recommend at that point considering trying a CPAP in which she would qualify for if needed. Pt verbalized understanding. Pt had no questions at this time but was encouraged to call back if questions arise. Advised to follow up as needed.

## 2019-12-23 ENCOUNTER — Other Ambulatory Visit (INDEPENDENT_AMBULATORY_CARE_PROVIDER_SITE_OTHER): Payer: Self-pay | Admitting: Primary Care

## 2019-12-23 DIAGNOSIS — G4733 Obstructive sleep apnea (adult) (pediatric): Secondary | ICD-10-CM

## 2019-12-27 NOTE — Telephone Encounter (Signed)
Per vo by Dr. Vickey Huger, she did have sleep study with the dental device

## 2019-12-27 NOTE — Telephone Encounter (Addendum)
I returned the call to the [facility] at Dr. Alveda Reasons office. She said the patient was provided with a temporary dental device to get through the HST.   If the patient is moving forward with CPAP, then they plan to just make a device for her to use as needed for TMJ. If she is not getting a CPAP, they will need to make a permanent device to help with her snoring. Burnett Harry stated there was some concern that she may have issues wearing a dental device every night due to the poor condition of her teeth. The patient is suppose to be rescheduling a cleaning.

## 2019-12-27 NOTE — Telephone Encounter (Signed)
Shelly from Dr. Alveda Reasons office called wanting to speak to RN about the dental device the pt uses for TMJ. Please advise. [phone]

## 2019-12-27 NOTE — Telephone Encounter (Signed)
Per vo by Dr. Vickey Huger, she did have another HST with the temporary device and it showed a reduction in events over the first study. She would like the patient to proceed with a permanent dental device. I returned the call to [facility] at Dr. Alveda Reasons office. I left a message with this information and provided our number to call back with any further questions.

## 2020-01-01 ENCOUNTER — Other Ambulatory Visit: Payer: Self-pay

## 2020-01-01 ENCOUNTER — Encounter (HOSPITAL_COMMUNITY): Payer: Self-pay

## 2020-01-01 ENCOUNTER — Ambulatory Visit (HOSPITAL_COMMUNITY)
Admission: EM | Admit: 2020-01-01 | Discharge: 2020-01-01 | Disposition: A | Discharge status: Home / Self Care | Payer: BC Managed Care – PPO | Attending: Family Medicine | Admitting: Family Medicine

## 2020-01-01 DIAGNOSIS — Z8249 Family history of ischemic heart disease and other diseases of the circulatory system: Secondary | ICD-10-CM | POA: Diagnosis not present

## 2020-01-01 DIAGNOSIS — Z833 Family history of diabetes mellitus: Secondary | ICD-10-CM | POA: Insufficient documentation

## 2020-01-01 DIAGNOSIS — K219 Gastro-esophageal reflux disease without esophagitis: Secondary | ICD-10-CM | POA: Diagnosis not present

## 2020-01-01 DIAGNOSIS — E785 Hyperlipidemia, unspecified: Secondary | ICD-10-CM | POA: Insufficient documentation

## 2020-01-01 DIAGNOSIS — R0981 Nasal congestion: Secondary | ICD-10-CM | POA: Insufficient documentation

## 2020-01-01 DIAGNOSIS — Z79899 Other long term (current) drug therapy: Secondary | ICD-10-CM | POA: Insufficient documentation

## 2020-01-01 DIAGNOSIS — Z20822 Contact with and (suspected) exposure to covid-19: Secondary | ICD-10-CM | POA: Insufficient documentation

## 2020-01-01 DIAGNOSIS — E119 Type 2 diabetes mellitus without complications: Secondary | ICD-10-CM | POA: Insufficient documentation

## 2020-01-01 DIAGNOSIS — F419 Anxiety disorder, unspecified: Secondary | ICD-10-CM | POA: Diagnosis not present

## 2020-01-01 DIAGNOSIS — R0602 Shortness of breath: Secondary | ICD-10-CM | POA: Insufficient documentation

## 2020-01-01 DIAGNOSIS — F4310 Post-traumatic stress disorder, unspecified: Secondary | ICD-10-CM | POA: Insufficient documentation

## 2020-01-01 DIAGNOSIS — Z87891 Personal history of nicotine dependence: Secondary | ICD-10-CM | POA: Insufficient documentation

## 2020-01-01 DIAGNOSIS — G4733 Obstructive sleep apnea (adult) (pediatric): Secondary | ICD-10-CM | POA: Diagnosis not present

## 2020-01-01 DIAGNOSIS — Z7984 Long term (current) use of oral hypoglycemic drugs: Secondary | ICD-10-CM | POA: Insufficient documentation

## 2020-01-01 DIAGNOSIS — I10 Essential (primary) hypertension: Secondary | ICD-10-CM | POA: Diagnosis not present

## 2020-01-01 DIAGNOSIS — Z886 Allergy status to analgesic agent status: Secondary | ICD-10-CM | POA: Diagnosis not present

## 2020-01-01 DIAGNOSIS — Z8349 Family history of other endocrine, nutritional and metabolic diseases: Secondary | ICD-10-CM | POA: Diagnosis not present

## 2020-01-01 LAB — SARS CORONAVIRUS 2 (TAT 6-24 HRS): SARS Coronavirus 2: NEGATIVE

## 2020-01-01 NOTE — ED Triage Notes (Signed)
Pt c/o SOB intermittently for past three days along with some mild CP. Pt describes mid-sternal and upper CP as intermittent heaviness. Denies dizziness, n/v, diaphoresis, radiating pain or changes to vision/ambulating/cognition/speaking. Also reports nasal congestion/runny nose not relieved by OTC allergy meds. Denies sore throat, HA, body aches, joint pain.  Pt reports h/o costochondritis.  Able to speak full sentences w/o difficulty.

## 2020-01-01 NOTE — Discharge Instructions (Addendum)
I believe your symptoms may be allergy or anxiety related.  You can do the Flonase daily and try switching to a Zyrtec. Use your clonazepam as needed for anxiety Follow up as needed for continued or worsening symptoms

## 2020-01-01 NOTE — ED Provider Notes (Signed)
[facility]    CSN: [id number] Arrival date & time: 01/01/20  1016      History   Chief Complaint Chief Complaint  Patient presents with  . Shortness of Breath  . Nasal Congestion  . Chest Pain    HPI Desiree Mckinney is a 59 y.o. female.   Patient is a 59 year old female with past medical history of anxiety, arthritis, diabetes, GERD, hyperlipidemia, hypertension, seasonal allergies.  She presents today with approximate 3 days of intermittent shortness of breath along with some mild chest discomfort.  This comes intermittent at random times and has not been daily.  The chest discomfort is midsternal and upper and describes as heaviness.  Denies any associated dizziness, nausea, vomiting or diaphoresis.  The pain does not radiate.  She also has some nasal congestion, runny nose.  Has been taking allergy medication without much relief.  She has been able to do daily walks without any respiratory distress or mild shortness of breath.  Believes this may be anxiety related to receive her second Covid vaccine this week.  She does take clonazepam as needed.  ROS per HPI      Past Medical History:  Diagnosis Date  . Anxiety   . Arthritis   . Diabetes (HCC)   . GERD (gastroesophageal reflux disease)   . Hyperlipidemia   . Hypertension   . Seasonal allergies     Patient Active Problem List   Diagnosis Date Noted  . OSA (obstructive sleep apnea) 12/01/2019  . Post traumatic stress disorder (PTSD) 11/08/2019  . Bruxism (teeth grinding) 11/08/2019  . 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 Loud snoring 11/08/2019  . Morbid obesity (HCC) 11/08/2019  . Psychophysiological insomnia 11/08/2019    Past Surgical History:  Procedure Laterality Date  . ceasarean section  1993  . CHONDROPLASTY Right 07/16/2018   Procedure: CHONDROPLASTY;  Surgeon: Bjorn Pippin, MD;  Location: [facility];  Service: Orthopedics;  Laterality: Right;  .  KNEE ARTHROSCOPY WITH MEDIAL MENISECTOMY Right 07/16/2018   Procedure: RIGHT ARTHROSCOPY KNEE CHONDROPLASTY, MEDIALMENISCECTOMY;  Surgeon: Bjorn Pippin, MD;  Location: [facility];  Service: Orthopedics;  Laterality: Right;  . SPLENECTOMY  childhood    OB History   No obstetric history on file.      Home Medications    Prior to Admission medications   Medication Sig Start Date End Date Taking? Authorizing Provider  atenolol (TENORMIN) 25 MG tablet Take by mouth daily.   Yes [provider]  clonazePAM (KLONOPIN) 0.5 MG tablet Take 0.5 mg 2 (two) times daily by mouth.   Yes [provider]  lisinopril (PRINIVIL,ZESTRIL) 10 MG tablet Take 10 mg by mouth daily.   Yes [provider]  loratadine (CLARITIN) 10 MG tablet Take 10 mg by mouth daily.   Yes [provider]  meclizine (ANTIVERT) 25 MG tablet Take 25 mg by mouth 3 (three) times daily as needed for dizziness.   Yes [provider]  metFORMIN (GLUCOPHAGE) 500 MG tablet Take 500 mg by mouth 2 (two) times daily with a meal.   Yes [provider]  omeprazole (PRILOSEC) 20 MG capsule Take 20 mg by mouth daily.   Yes [provider]  pravastatin (PRAVACHOL) 20 MG tablet Take 20 mg by mouth daily.   Yes [provider]  ibuprofen (ADVIL,MOTRIN) 600 MG tablet Take 600 mg by mouth every 6 (six) hours as needed.    [provider]  lisinopril (  ZESTRIL) 5 MG tablet Take 5 mg by mouth daily. 12/05/19   [provider]  methocarbamol (ROBAXIN) 500 MG tablet Take 1,000 mg 3 (three) times daily by mouth.    [provider]    Family History Family History  Problem Relation Age of Onset  . Diabetes Father   . Heart failure Father   . Hypertension Father   . Hyperlipidemia Father   . Colon cancer Neg Hx     Social History Social History   Tobacco Use  . Smoking status: Former Smoker    Quit date: 09/16/1984    Years since  quitting: 35.3  . Smokeless tobacco: Never Used  Substance Use Topics  . Alcohol use: Yes    Alcohol/week: 0.0 standard drinks    Comment: occas  . Drug use: No     Allergies   Aspirin   Review of Systems Review of Systems   Physical Exam Triage Vital Signs ED Triage Vitals  Enc Vitals Group     BP 01/01/20 1051 134/86     Pulse Rate 01/01/20 1051 93     Resp 01/01/20 1051 18     Temp 01/01/20 1051 98.2 F (36.8 C)     Temp Source 01/01/20 1051 Oral     SpO2 01/01/20 1051 99 %     Weight --      Height --      Head Circumference --      Peak Flow --      Pain Score 01/01/20 1048 1     Pain Loc --      Pain Edu? --      Excl. in GC? --    No data found.  Updated Vital Signs BP 134/86 (BP Location: Right Arm)   Pulse 93   Temp 98.2 F (36.8 C) (Oral)   Resp 18   LMP 05/17/2012 (Approximate)   SpO2 99%   Visual Acuity Right Eye Distance:   Left Eye Distance:   Bilateral Distance:    Right Eye Near:   Left Eye Near:    Bilateral Near:     Physical Exam Vitals and nursing note reviewed.  Constitutional:      General: She is not in acute distress.    Appearance: Normal appearance. She is not ill-appearing, toxic-appearing or diaphoretic.  HENT:     Head: Normocephalic.     Nose: Nose normal.  Eyes:     Conjunctiva/sclera: Conjunctivae normal.  Cardiovascular:     Rate and Rhythm: Normal rate and regular rhythm.     Pulses: Normal pulses.     Heart sounds: Normal heart sounds.  Pulmonary:     Effort: Pulmonary effort is normal.     Breath sounds: Normal breath sounds.  Musculoskeletal:        General: Normal range of motion.     Cervical back: Normal range of motion.  Skin:    General: Skin is warm and dry.     Findings: No rash.  Neurological:     Mental Status: She is alert.  Psychiatric:        Mood and Affect: Mood normal.      UC Treatments / Results  Labs (all labs ordered are listed, but only abnormal results are  displayed) Labs Reviewed  SARS CORONAVIRUS 2 (TAT 6-24 HRS)    EKG   Radiology No results found.  Procedures Procedures (including critical care time)  Medications Ordered in UC Medications - No data to display  Initial Impression / Assessment and Plan / UC Course  I have reviewed the triage vital signs and the nursing notes.  Pertinent labs & imaging results that were available during my care of the patient were reviewed by me and considered in my medical decision making (see chart for details).     Anxiety-most likely symptoms are related to anxiety and maybe some mild allergies.  She has had some anxiety about getting her second Covid vaccine this week. EKG with normal sinus rhythm and normal rate today.  No concern for ACS today She can try switching to Zyrtec instead of Claritin.  Continue the Flonase She can take her clonazepam as needed to prevent panic attack  Return precautions given Follow up as needed for continued or worsening symptoms  Final Clinical Impressions(s) / UC Diagnoses   Final diagnoses:  Anxiety     Discharge Instructions     I believe your symptoms may be allergy or anxiety related.  You can do the Flonase daily and try switching to a Zyrtec. Use your clonazepam as needed for anxiety Follow up as needed for continued or worsening symptoms     ED Prescriptions    None     PDMP not reviewed this encounter.   Orvan July, NP 01/01/20 1136

## 2020-01-21 ENCOUNTER — Encounter: Payer: Self-pay | Admitting: Gastroenterology

## 2020-03-15 ENCOUNTER — Encounter: Payer: BC Managed Care – PPO | Admitting: Gastroenterology

## 2020-06-20 ENCOUNTER — Other Ambulatory Visit: Payer: Self-pay | Admitting: Primary Care

## 2020-06-20 ENCOUNTER — Ambulatory Visit
Admission: RE | Admit: 2020-06-20 | Discharge: 2020-06-20 | Disposition: A | Discharge status: Home / Self Care | Payer: BC Managed Care – PPO | Source: Ambulatory Visit | Attending: Primary Care | Admitting: Primary Care

## 2020-06-20 ENCOUNTER — Other Ambulatory Visit: Payer: Self-pay

## 2020-06-20 DIAGNOSIS — Z1231 Encounter for screening mammogram for malignant neoplasm of breast: Secondary | ICD-10-CM

## 2021-03-13 ENCOUNTER — Other Ambulatory Visit: Payer: Self-pay

## 2021-03-13 ENCOUNTER — Encounter (HOSPITAL_BASED_OUTPATIENT_CLINIC_OR_DEPARTMENT_OTHER): Payer: Self-pay | Admitting: Orthopaedic Surgery

## 2021-03-14 ENCOUNTER — Encounter (HOSPITAL_BASED_OUTPATIENT_CLINIC_OR_DEPARTMENT_OTHER)
Admission: RE | Admit: 2021-03-14 | Discharge: 2021-03-14 | Disposition: A | Discharge status: Home / Self Care | Payer: 59 | Source: Ambulatory Visit | Attending: Orthopaedic Surgery | Admitting: Orthopaedic Surgery

## 2021-03-14 ENCOUNTER — Other Ambulatory Visit: Payer: Self-pay

## 2021-03-14 DIAGNOSIS — I10 Essential (primary) hypertension: Secondary | ICD-10-CM | POA: Insufficient documentation

## 2021-03-14 DIAGNOSIS — E119 Type 2 diabetes mellitus without complications: Secondary | ICD-10-CM | POA: Diagnosis not present

## 2021-03-14 DIAGNOSIS — Z01818 Encounter for other preprocedural examination: Secondary | ICD-10-CM | POA: Insufficient documentation

## 2021-03-14 LAB — BASIC METABOLIC PANEL
Anion gap: 7 (ref 5–15)
BUN: 11 mg/dL (ref 6–20)
CO2: 26 mmol/L (ref 22–32)
Calcium: 9.3 mg/dL (ref 8.9–10.3)
Chloride: 105 mmol/L (ref 98–111)
Creatinine, Ser: 0.64 mg/dL (ref 0.44–1.00)
GFR, Estimated: >60 mL/min (ref >60)
Glucose, Bld: 106 mg/dL — ABNORMAL HIGH (ref 70–99)
Potassium: 4.5 mmol/L (ref 3.5–5.1)
Sodium: 138 mmol/L (ref 135–145)

## 2021-03-14 NOTE — Progress Notes (Signed)

## 2021-03-21 ENCOUNTER — Ambulatory Visit (HOSPITAL_BASED_OUTPATIENT_CLINIC_OR_DEPARTMENT_OTHER): Admission: RE | Admit: 2021-03-21 | Payer: 59 | Source: Home / Self Care | Admitting: Orthopaedic Surgery

## 2021-03-21 HISTORY — DX: Other tear of medial meniscus, current injury, unspecified knee, initial encounter: S83.249A

## 2021-03-21 HISTORY — DX: Sleep apnea, unspecified: G47.30

## 2021-03-21 SURGERY — ARTHROSCOPY, KNEE, WITH MEDIAL MENISCECTOMY
Anesthesia: Choice | Site: Knee | Laterality: Left

## 2021-04-26 ENCOUNTER — Observation Stay (HOSPITAL_COMMUNITY)
Admission: EM | Admit: 2021-04-26 | Discharge: 2021-04-28 | Disposition: A | Discharge status: Home / Self Care | Payer: 59 | Attending: Internal Medicine | Admitting: Internal Medicine

## 2021-04-26 ENCOUNTER — Emergency Department (HOSPITAL_COMMUNITY): Payer: 59

## 2021-04-26 ENCOUNTER — Other Ambulatory Visit: Payer: Self-pay

## 2021-04-26 ENCOUNTER — Encounter (HOSPITAL_COMMUNITY): Payer: Self-pay | Admitting: Emergency Medicine

## 2021-04-26 DIAGNOSIS — R079 Chest pain, unspecified: Secondary | ICD-10-CM | POA: Diagnosis present

## 2021-04-26 DIAGNOSIS — Z87891 Personal history of nicotine dependence: Secondary | ICD-10-CM | POA: Insufficient documentation

## 2021-04-26 DIAGNOSIS — I4581 Long QT syndrome: Secondary | ICD-10-CM | POA: Diagnosis not present

## 2021-04-26 DIAGNOSIS — R778 Other specified abnormalities of plasma proteins: Secondary | ICD-10-CM

## 2021-04-26 DIAGNOSIS — I447 Left bundle-branch block, unspecified: Secondary | ICD-10-CM | POA: Diagnosis not present

## 2021-04-26 DIAGNOSIS — I10 Essential (primary) hypertension: Secondary | ICD-10-CM | POA: Insufficient documentation

## 2021-04-26 DIAGNOSIS — E669 Obesity, unspecified: Secondary | ICD-10-CM

## 2021-04-26 DIAGNOSIS — Z79899 Other long term (current) drug therapy: Secondary | ICD-10-CM | POA: Diagnosis not present

## 2021-04-26 DIAGNOSIS — E1169 Type 2 diabetes mellitus with other specified complication: Secondary | ICD-10-CM

## 2021-04-26 DIAGNOSIS — Z20822 Contact with and (suspected) exposure to covid-19: Secondary | ICD-10-CM | POA: Diagnosis not present

## 2021-04-26 DIAGNOSIS — Z7984 Long term (current) use of oral hypoglycemic drugs: Secondary | ICD-10-CM | POA: Insufficient documentation

## 2021-04-26 DIAGNOSIS — E119 Type 2 diabetes mellitus without complications: Secondary | ICD-10-CM | POA: Insufficient documentation

## 2021-04-26 DIAGNOSIS — R9431 Abnormal electrocardiogram [ECG] [EKG]: Secondary | ICD-10-CM

## 2021-04-26 LAB — BASIC METABOLIC PANEL
Anion gap: 9 (ref 5–15)
BUN: 14 mg/dL (ref 6–20)
CO2: 24 mmol/L (ref 22–32)
Calcium: 9.4 mg/dL (ref 8.9–10.3)
Chloride: 106 mmol/L (ref 98–111)
Creatinine, Ser: 0.6 mg/dL (ref 0.44–1.00)
GFR, Estimated: >60 mL/min (ref >60)
Glucose, Bld: 124 mg/dL — ABNORMAL HIGH (ref 70–99)
Potassium: 3.2 mmol/L — ABNORMAL LOW (ref 3.5–5.1)
Sodium: 139 mmol/L (ref 135–145)

## 2021-04-26 LAB — TROPONIN I (HIGH SENSITIVITY)
Troponin I (High Sensitivity): 8 ng/L (ref <18)
Troponin I (High Sensitivity): 30 ng/L — ABNORMAL HIGH (ref <18)
Troponin I (High Sensitivity): 37 ng/L — ABNORMAL HIGH (ref <18)

## 2021-04-26 LAB — CBC
HCT: 37.1 % (ref 36.0–46.0)
Hemoglobin: 11.8 g/dL — ABNORMAL LOW (ref 12.0–15.0)
MCH: 30.1 pg (ref 26.0–34.0)
MCHC: 31.8 g/dL (ref 30.0–36.0)
MCV: 94.6 fL (ref 80.0–100.0)
Platelets: 264 10*3/uL (ref 150–400)
RBC: 3.92 MIL/uL (ref 3.87–5.11)
RDW: 14.3 % (ref 11.5–15.5)
WBC: 14.1 10*3/uL — ABNORMAL HIGH (ref 4.0–10.5)
nRBC: 0 % (ref 0.0–0.2)

## 2021-04-26 NOTE — ED Notes (Signed)
Pt ambulated to bathroom, shob w/ exertion, c/o chest pain w/ movement

## 2021-04-26 NOTE — ED Provider Notes (Signed)
[facility] EMERGENCY DEPARTMENT Provider Note   CSN: [id number] Arrival date & time: 04/26/21  1639     History Chief Complaint  Patient presents with   Chest Pain    Desiree Mckinney is a 60 y.o. female.  HPI 60 year old female history of hypertension, hyperlipidemia, diabetes presents today with chest pain.  She reports that she has had some pain in the right side of her chest.  Today she was lifting a pot cooking.  She had much more severe pain in her chest.  Sharp and in the substernal area.  She denies any associated symptoms.  She denies any history of coronary artery disease.  She has some pain with inspiration but is not dyspneic.       Past Medical History:  Diagnosis Date   Anxiety    Arthritis    Diabetes (HCC)    GERD (gastroesophageal reflux disease)    Hyperlipidemia    Hypertension    MMT (medial meniscus tear)    left   Seasonal allergies    Sleep apnea    does not use CPAP or oral appliance    Patient Active Problem List   Diagnosis Date Noted   OSA (obstructive sleep apnea) 12/01/2019   Post traumatic stress disorder (PTSD) 11/08/2019   Bruxism (teeth grinding) 11/08/2019   Arthropathy of both temporomandibular joints 11/08/2019   Loud snoring 11/08/2019   Morbid obesity (HCC) 11/08/2019   Psychophysiological insomnia 11/08/2019    Past Surgical History:  Procedure Laterality Date   ceasarean section  1993   CHONDROPLASTY Right 07/16/2018   Procedure: CHONDROPLASTY;  Surgeon: Bjorn Pippin, MD;  Location: [facility];  Service: Orthopedics;  Laterality: Right;   KNEE ARTHROSCOPY WITH MEDIAL MENISECTOMY Right 07/16/2018   Procedure: RIGHT ARTHROSCOPY KNEE CHONDROPLASTY, MEDIALMENISCECTOMY;  Surgeon: Bjorn Pippin, MD;  Location: [facility];  Service: Orthopedics;  Laterality: Right;   SPLENECTOMY  childhood     OB History   No obstetric history on file.     Family History  Problem  Relation Age of Onset   Diabetes Father    Heart failure Father    Hypertension Father    Hyperlipidemia Father    Colon cancer Neg Hx     Social History   Tobacco Use   Smoking status: Former    Types: Cigarettes    Quit date: 09/16/1984    Years since quitting: 36.6   Smokeless tobacco: Never  Substance Use Topics   Alcohol use: Not Currently   Drug use: No    Home Medications Prior to Admission medications   Medication Sig Start Date End Date Taking? Authorizing Provider  atenolol (TENORMIN) 25 MG tablet Take by mouth daily.    [provider]  clonazePAM (KLONOPIN) 0.5 MG tablet Take 0.5 mg 2 (two) times daily by mouth.    [provider]  ibuprofen (ADVIL,MOTRIN) 600 MG tablet Take 800 mg by mouth every 6 (six) hours as needed.    [provider]  lisinopril (PRINIVIL,ZESTRIL) 10 MG tablet Take 10 mg by mouth daily.    [provider]  loratadine (CLARITIN) 10 MG tablet Take 10 mg by mouth daily.    [provider]  meclizine (ANTIVERT) 25 MG tablet Take 25 mg by mouth 3 (three) times daily as needed for dizziness.    [provider]  metFORMIN (GLUCOPHAGE) 500 MG tablet Take 500 mg by mouth daily with breakfast.    [provider]  methocarbamol (ROBAXIN) 500 MG tablet Take 1,000 mg 3 (three) times daily by mouth.    [provider]  omeprazole (PRILOSEC) 20 MG capsule Take 20 mg by mouth daily.    [provider]  pravastatin (PRAVACHOL) 20 MG tablet Take 20 mg by mouth daily.    [provider]    Allergies    Aspirin  Review of Systems   Review of Systems  All other systems reviewed and are negative.  Physical Exam Updated Vital Signs BP (!) 136/54   Pulse 75   Temp 99 F (37.2 C) (Oral)   Resp 17   LMP 05/17/2012 (Approximate)   SpO2 98%   Physical Exam Vitals and nursing note reviewed.  Constitutional:      General: She is not in acute distress.    Appearance:  She is obese.  HENT:     Head: Normocephalic.  Eyes:     Pupils: Pupils are equal, round, and reactive to light.  Cardiovascular:     Rate and Rhythm: Normal rate and regular rhythm.     Heart sounds: Normal heart sounds.     Comments: Chest pain is reproducible on palpation Pulmonary:     Effort: Pulmonary effort is normal.     Breath sounds: Normal breath sounds.  Abdominal:     General: Bowel sounds are normal.     Palpations: Abdomen is soft.  Musculoskeletal:        General: Normal range of motion.     Cervical back: Normal range of motion.  Skin:    General: Skin is warm.     Capillary Refill: Capillary refill takes less than 2 seconds.  Neurological:     General: No focal deficit present.     Mental Status: She is alert.  Psychiatric:        Mood and Affect: Mood normal.    ED Results / Procedures / Treatments   Labs (all labs ordered are listed, but only abnormal results are displayed) Labs Reviewed  BASIC METABOLIC PANEL - Abnormal; Notable for the following components:      Result Value   Potassium 3.2 (*)    Glucose, Bld 124 (*)    All other components within normal limits  CBC - Abnormal; Notable for the following components:   WBC 14.1 (*)    Hemoglobin 11.8 (*)    All other components within normal limits  TROPONIN I (HIGH SENSITIVITY) - Abnormal; Notable for the following components:   Troponin I (High Sensitivity) 30 (*)    All other components within normal limits  TROPONIN I (HIGH SENSITIVITY) - Abnormal; Notable for the following components:   Troponin I (High Sensitivity) 37 (*)    All other components within normal limits  TROPONIN I (HIGH SENSITIVITY)    EKG EKG Interpretation  Date/Time:  Thursday April 26 2021 16:51:33 EDT Ventricular Rate:  104 PR Interval:  172 QRS Duration: 136 QT Interval:  374 QTC Calculation: 491 R Axis:   6 Text Interpretation: Sinus tachycardia Left bundle branch block Abnormal ECG Confirmed by Margarita Grizzle  [id number]) on 04/26/2021 5:06:57 PM  Radiology DG Chest 2 View  Result Date: 04/26/2021 CLINICAL DATA:  Chest pain. EXAM: CHEST - 2 VIEW COMPARISON:  May 11, 2018. FINDINGS: The heart size and mediastinal contours are within normal limits. Both lungs are clear. The visualized skeletal structures are unremarkable. IMPRESSION: No active cardiopulmonary disease. Electronically Signed   By: Zenda Alpers.D.  On: 04/26/2021 18:22    Procedures Procedures   Medications Ordered in ED Medications - No data to display  ED Course  I have reviewed the triage vital signs and the nursing notes.  Pertinent labs & imaging results that were available during my care of the patient were reviewed by me and considered in my medical decision making (see chart for details).    MDM Rules/Calculators/A&P                           60 year old female with very atypical chest pain.  Vital signs are stable.  EKG with new left bundle branch block.  She has allergy to aspirin.  She has had a mild increase in her troponin troponins have gone from 8 to 30-37.  X-Mellina Benison is clear. Plan consultation to cardiology and admission for ongoing evaluation. With Dr. Noemi Chapel who advises CTA and admission to medicine service. Discussed with Dr. Keenan Bachelor, who will see for admission Final Clinical Impression(s) / ED Diagnoses Final diagnoses:  Chest pain, unspecified type  LBBB (left bundle branch block)    Rx / DC Orders ED Discharge Orders     None        Margarita Grizzle, MD 04/27/21 0005

## 2021-04-26 NOTE — ED Triage Notes (Signed)
Pt reports central chest pain and shortness of breath that started approx 30 mins pta. States pain started while cooking and after opening an air fryer.

## 2021-04-27 ENCOUNTER — Observation Stay (HOSPITAL_COMMUNITY): Payer: 59

## 2021-04-27 ENCOUNTER — Encounter (HOSPITAL_COMMUNITY): Payer: Self-pay | Admitting: Family Medicine

## 2021-04-27 ENCOUNTER — Observation Stay (HOSPITAL_BASED_OUTPATIENT_CLINIC_OR_DEPARTMENT_OTHER): Payer: 59

## 2021-04-27 DIAGNOSIS — I10 Essential (primary) hypertension: Secondary | ICD-10-CM

## 2021-04-27 DIAGNOSIS — R079 Chest pain, unspecified: Secondary | ICD-10-CM

## 2021-04-27 DIAGNOSIS — R9431 Abnormal electrocardiogram [ECG] [EKG]: Secondary | ICD-10-CM | POA: Diagnosis not present

## 2021-04-27 DIAGNOSIS — R778 Other specified abnormalities of plasma proteins: Secondary | ICD-10-CM

## 2021-04-27 DIAGNOSIS — E1169 Type 2 diabetes mellitus with other specified complication: Secondary | ICD-10-CM

## 2021-04-27 LAB — BASIC METABOLIC PANEL
Anion gap: 6 (ref 5–15)
BUN: 10 mg/dL (ref 6–20)
CO2: 25 mmol/L (ref 22–32)
Calcium: 8.8 mg/dL — ABNORMAL LOW (ref 8.9–10.3)
Chloride: 108 mmol/L (ref 98–111)
Creatinine, Ser: 0.58 mg/dL (ref 0.44–1.00)
GFR, Estimated: >60 mL/min (ref >60)
Glucose, Bld: 134 mg/dL — ABNORMAL HIGH (ref 70–99)
Potassium: 3.5 mmol/L (ref 3.5–5.1)
Sodium: 139 mmol/L (ref 135–145)

## 2021-04-27 LAB — SARS CORONAVIRUS 2 (TAT 6-24 HRS): SARS Coronavirus 2: NEGATIVE

## 2021-04-27 LAB — CBG MONITORING, ED
Glucose-Capillary: 135 mg/dL — ABNORMAL HIGH (ref 70–99)
Glucose-Capillary: 117 mg/dL — ABNORMAL HIGH (ref 70–99)

## 2021-04-27 LAB — NM MYOCAR MULTI W/SPECT W/WALL MOTION / EF
Estimated workload: 1 METS
Exercise duration (min): 8 min
Exercise duration (sec): 43 s
Peak HR: 139 {beats}/min
Rest HR: 79 {beats}/min

## 2021-04-27 LAB — CBC
HCT: 33.9 % — ABNORMAL LOW (ref 36.0–46.0)
Hemoglobin: 11.2 g/dL — ABNORMAL LOW (ref 12.0–15.0)
MCH: 30.7 pg (ref 26.0–34.0)
MCHC: 33 g/dL (ref 30.0–36.0)
MCV: 92.9 fL (ref 80.0–100.0)
Platelets: 257 10*3/uL (ref 150–400)
RBC: 3.65 MIL/uL — ABNORMAL LOW (ref 3.87–5.11)
RDW: 14.3 % (ref 11.5–15.5)
WBC: 11.5 10*3/uL — ABNORMAL HIGH (ref 4.0–10.5)
nRBC: 0 % (ref 0.0–0.2)

## 2021-04-27 LAB — ECHOCARDIOGRAM COMPLETE
Area-P 1/2: 3.54 cm2
Calc EF: 66.3 %
S' Lateral: 3.1 cm
Single Plane A2C EF: 68.7 %
Single Plane A4C EF: 66.7 %

## 2021-04-27 LAB — TROPONIN I (HIGH SENSITIVITY)
Troponin I (High Sensitivity): 27 ng/L — ABNORMAL HIGH (ref <18)
Troponin I (High Sensitivity): 26 ng/L — ABNORMAL HIGH (ref <18)
Troponin I (High Sensitivity): 22 ng/L — ABNORMAL HIGH (ref <18)
Troponin I (High Sensitivity): 17 ng/L (ref <18)

## 2021-04-27 LAB — HEMOGLOBIN A1C
Hgb A1c MFr Bld: 6.7 % — ABNORMAL HIGH (ref 4.8–5.6)
Mean Plasma Glucose: 145.59 mg/dL

## 2021-04-27 LAB — GLUCOSE, CAPILLARY
Glucose-Capillary: 157 mg/dL — ABNORMAL HIGH (ref 70–99)
Glucose-Capillary: 138 mg/dL — ABNORMAL HIGH (ref 70–99)

## 2021-04-27 LAB — MAGNESIUM: Magnesium: 1.6 mg/dL — ABNORMAL LOW (ref 1.7–2.4)

## 2021-04-27 LAB — HEPARIN LEVEL (UNFRACTIONATED): Heparin Unfractionated: <0.1 IU/mL — ABNORMAL LOW (ref 0.30–0.70)

## 2021-04-27 LAB — CREATININE, SERUM
Creatinine, Ser: 0.52 mg/dL (ref 0.44–1.00)
GFR, Estimated: >60 mL/min (ref >60)

## 2021-04-27 LAB — HIV ANTIBODY (ROUTINE TESTING W REFLEX): HIV Screen 4th Generation wRfx: NONREACTIVE

## 2021-04-27 MED ORDER — OXYCODONE HCL 5 MG PO TABS
5.0000 mg | ORAL_TABLET | Freq: Four times a day (QID) | ORAL | Status: DC | PRN
  Administered 2021-04-27: 5 mg via ORAL
  Filled 2021-04-27: qty 1

## 2021-04-27 MED ORDER — REGADENOSON 0.4 MG/5ML IV SOLN
INTRAVENOUS | Status: AC
  Administered 2021-04-27: 0.4 mg via INTRAVENOUS
  Filled 2021-04-27: qty 5

## 2021-04-27 MED ORDER — MAGNESIUM SULFATE 4 GM/100ML IV SOLN
4.0000 g | Freq: Once | INTRAVENOUS | Status: AC
  Administered 2021-04-27: 4 g via INTRAVENOUS
  Filled 2021-04-27: qty 100

## 2021-04-27 MED ORDER — IOHEXOL 350 MG/ML SOLN
75.0000 mL | Freq: Once | INTRAVENOUS | Status: AC | PRN
  Administered 2021-04-27: 75 mL via INTRAVENOUS

## 2021-04-27 MED ORDER — PRAVASTATIN SODIUM 10 MG PO TABS
20.0000 mg | ORAL_TABLET | Freq: Every day | ORAL | Status: DC
  Administered 2021-04-28: 20 mg via ORAL
  Filled 2021-04-27: qty 2

## 2021-04-27 MED ORDER — CLONAZEPAM 0.5 MG PO TABS
0.5000 mg | ORAL_TABLET | Freq: Three times a day (TID) | ORAL | Status: DC | PRN
  Administered 2021-04-28: 0.5 mg via ORAL
  Filled 2021-04-27: qty 1

## 2021-04-27 MED ORDER — ACETAMINOPHEN 325 MG PO TABS: 650.0000 mg | ORAL_TABLET | Freq: Four times a day (QID) | ORAL | Status: DC | PRN

## 2021-04-27 MED ORDER — TECHNETIUM TC 99M TETROFOSMIN IV KIT
10.0000 | PACK | Freq: Once | INTRAVENOUS | Status: AC | PRN
  Administered 2021-04-27: 10 via INTRAVENOUS

## 2021-04-27 MED ORDER — KETOROLAC TROMETHAMINE 15 MG/ML IJ SOLN
15.0000 mg | Freq: Once | INTRAMUSCULAR | Status: AC
  Administered 2021-04-27: 15 mg via INTRAVENOUS
  Filled 2021-04-27: qty 1

## 2021-04-27 MED ORDER — INSULIN ASPART 100 UNIT/ML IJ SOLN
0.0000 [IU] | Freq: Three times a day (TID) | INTRAMUSCULAR | Status: DC
  Administered 2021-04-27: 3 [IU] via SUBCUTANEOUS
  Administered 2021-04-28: 2 [IU] via SUBCUTANEOUS

## 2021-04-27 MED ORDER — ENOXAPARIN SODIUM 40 MG/0.4ML IJ SOSY: 40.0000 mg | PREFILLED_SYRINGE | Freq: Every day | INTRAMUSCULAR | Status: DC

## 2021-04-27 MED ORDER — HEPARIN BOLUS VIA INFUSION
4000.0000 [IU] | Freq: Once | INTRAVENOUS | Status: AC
  Administered 2021-04-27: 4000 [IU] via INTRAVENOUS
  Filled 2021-04-27: qty 4000

## 2021-04-27 MED ORDER — SODIUM CHLORIDE 0.9 % IV SOLN: 250.0000 mL | INTRAVENOUS | Status: DC | PRN

## 2021-04-27 MED ORDER — REGADENOSON 0.4 MG/5ML IV SOLN: 0.4000 mg | Freq: Once | INTRAVENOUS | Status: AC

## 2021-04-27 MED ORDER — INSULIN ASPART 100 UNIT/ML IJ SOLN: 0.0000 [IU] | Freq: Every day | INTRAMUSCULAR | Status: DC

## 2021-04-27 MED ORDER — ATENOLOL 25 MG PO TABS
25.0000 mg | ORAL_TABLET | Freq: Every day | ORAL | Status: DC
  Administered 2021-04-28: 25 mg via ORAL
  Filled 2021-04-27: qty 1

## 2021-04-27 MED ORDER — IBUPROFEN 200 MG PO TABS
400.0000 mg | ORAL_TABLET | Freq: Four times a day (QID) | ORAL | Status: DC | PRN
  Administered 2021-04-27: 400 mg via ORAL
  Filled 2021-04-27: qty 2

## 2021-04-27 MED ORDER — LISINOPRIL 10 MG PO TABS
10.0000 mg | ORAL_TABLET | Freq: Every day | ORAL | Status: DC
  Administered 2021-04-28: 10 mg via ORAL
  Filled 2021-04-27: qty 1

## 2021-04-27 MED ORDER — HEPARIN (PORCINE) 25000 UT/250ML-% IV SOLN
900.0000 [IU]/h | INTRAVENOUS | Status: DC
  Administered 2021-04-27: 900 [IU]/h via INTRAVENOUS
  Filled 2021-04-27: qty 250

## 2021-04-27 MED ORDER — SODIUM CHLORIDE 0.9% FLUSH
3.0000 mL | Freq: Two times a day (BID) | INTRAVENOUS | Status: DC
  Administered 2021-04-27 (×3): 3 mL via INTRAVENOUS

## 2021-04-27 MED ORDER — CLONAZEPAM 0.5 MG PO TABS: 0.5000 mg | ORAL_TABLET | Freq: Two times a day (BID) | ORAL | Status: DC

## 2021-04-27 MED ORDER — SODIUM CHLORIDE 0.9% FLUSH
3.0000 mL | INTRAVENOUS | Status: DC | PRN
  Administered 2021-04-27: 3 mL via INTRAVENOUS

## 2021-04-27 MED ORDER — PANTOPRAZOLE SODIUM 40 MG PO TBEC
40.0000 mg | DELAYED_RELEASE_TABLET | Freq: Every day | ORAL | Status: DC
  Administered 2021-04-27 – 2021-04-28 (×2): 40 mg via ORAL
  Filled 2021-04-27 (×2): qty 1

## 2021-04-27 MED ORDER — POTASSIUM CHLORIDE CRYS ER 20 MEQ PO TBCR: 40.0000 meq | EXTENDED_RELEASE_TABLET | Freq: Once | ORAL | Status: DC

## 2021-04-27 MED ORDER — ACETAMINOPHEN 650 MG RE SUPP: 650.0000 mg | Freq: Four times a day (QID) | RECTAL | Status: DC | PRN

## 2021-04-27 MED ORDER — OXYCODONE-ACETAMINOPHEN 5-325 MG PO TABS
1.0000 | ORAL_TABLET | Freq: Once | ORAL | Status: AC
  Administered 2021-04-27: 1 via ORAL
  Filled 2021-04-27: qty 1

## 2021-04-27 MED ORDER — POTASSIUM CHLORIDE CRYS ER 20 MEQ PO TBCR
40.0000 meq | EXTENDED_RELEASE_TABLET | Freq: Once | ORAL | Status: AC
  Administered 2021-04-27: 40 meq via ORAL
  Filled 2021-04-27: qty 2

## 2021-04-27 MED ORDER — TECHNETIUM TC 99M TETROFOSMIN IV KIT
27.7000 | PACK | Freq: Once | INTRAVENOUS | Status: AC | PRN
  Administered 2021-04-27: 27.7 via INTRAVENOUS

## 2021-04-27 NOTE — Plan of Care (Signed)
  Problem: Activity: Goal: Risk for activity intolerance will decrease Outcome: Progressing   Problem: Nutrition: Goal: Adequate nutrition will be maintained Outcome: Progressing   Problem: Coping: Goal: Level of anxiety will decrease Outcome: Progressing   Problem: Elimination: Goal: Will not experience complications related to bowel motility Outcome: Progressing   

## 2021-04-27 NOTE — ED Notes (Signed)
Per Dr. Rachael Darby - I would leave NPO now as card stated they may do stress test in am

## 2021-04-27 NOTE — ED Notes (Signed)
Pt to CT

## 2021-04-27 NOTE — ED Notes (Signed)
Pt back from CT - ambulatory to restroom

## 2021-04-27 NOTE — H&P (Signed)
History and Physical    MERRIT WAUGH NTI:[id number] DOB: July 01, 1961 DOA: 04/26/2021  PCP: Valerie Roys, FNP   Patient coming from: Home  Chief Complaint: Chest pain  HPI: Desiree Mckinney is a 60 y.o. female with medical history significant for DMT2, HTN, HLD, obseity who presents for evaluation of chest pain.  She reports that yesterday while she was cooking dinner she developed a right-sided sharp chest pain that she rated as a 10 out of 10.  She reports it started around 4:00 and was constant for approximately 8 hours.  She was given nitroglycerin and aspirin in the emergency room but chest pain did not significantly improve.  She reports is still a 6 out of 10 when I evaluate her.  It was associated with some nausea and shortness of breath but not diaphoresis.  She states that she normally has some chest pain from costochondritis and the chest pain is reproducible to touch and palpation of her right chest wall but states that the pain tonight was more severe than it usually is.  The pain also occurs when she takes a deep breath or twists her torso.  In the emergency room EKG did not show ST elevation or depression but did have a new left bundle branch block with some mild tachycardia.  ED Course: In the emergency room she has been hemodynamically stable.  CTA of the chest was obtained to rule out PE and was negative for pulmonary embolism.  Her troponin initially was 8 but then it increased to 30 and 37 on repeat testing in the emergency room.  Other lab work revealed a WBC of 14,100 with a hemoglobin 11.8 hematocrit 37.1 and platelets of 264,000.  Sodium was 139 with potassium 3.2, chloride 106, bicarb 24, creatinine 0.60, BUN 14, calcium 9.4, glucose 124.  Hospitalist service was asked to evaluate patient and manage overnight  Review of Systems:  General: Denies fever, chills, weight loss, night sweats.  Denies dizziness.  Denies change in appetite HENT: Denies head trauma,  headache, denies change in hearing, tinnitus.  Denies nasal congestion.  Denies sore throat.  Denies difficulty swallowing Eyes: Denies blurry vision, pain in eye, drainage.  Denies discoloration of eyes. Neck: Denies pain.  Denies swelling.  Denies pain with movement. Cardiovascular: Reports right sided chest pain. Denies palpitations.  Denies edema.  Denies orthopnea Respiratory: Reports shortness of breath, cough.  Denies wheezing.  Denies sputum production Gastrointestinal: Denies abdominal pain, swelling. Reports nausea but no vomiting, diarrhea.  Denies melena.  Denies hematemesis. Musculoskeletal: Denies limitation of movement.  Denies deformity or swelling.  Denies pain.  Denies arthralgias or myalgias. Genitourinary: Denies pelvic pain.  Denies urinary frequency or hesitancy.  Denies dysuria.  Skin: Denies rash.  Denies petechiae, purpura, ecchymosis. Neurological: Denies syncope.  Denies seizure activity.  Denies paresthesia. Denies slurred speech, drooping face. Denies visual change. Psychiatric: Denies depression, anxiety.  Denies hallucinations.  Past Medical History:  Diagnosis Date   Anxiety    Arthritis    Diabetes (HCC)    GERD (gastroesophageal reflux disease)    Hyperlipidemia    Hypertension    MMT (medial meniscus tear)    left   Seasonal allergies    Sleep apnea    does not use CPAP or oral appliance    Past Surgical History:  Procedure Laterality Date   ceasarean section  1993   CHONDROPLASTY Right 07/16/2018   Procedure: CHONDROPLASTY;  Surgeon: Bjorn Pippin, MD;  Location: [facility];  Service: Orthopedics;  Laterality: Right;   KNEE ARTHROSCOPY WITH MEDIAL MENISECTOMY Right 07/16/2018   Procedure: RIGHT ARTHROSCOPY KNEE CHONDROPLASTY, MEDIALMENISCECTOMY;  Surgeon: Bjorn Pippin, MD;  Location: [facility];  Service: Orthopedics;  Laterality: Right;   SPLENECTOMY  childhood    Social History  reports that she quit smoking  about 36 years ago. Her smoking use included cigarettes. She has never used smokeless tobacco. She reports that she does not currently use alcohol. She reports that she does not use drugs.  Allergies  Allergen Reactions   Aspirin Palpitations    Family History  Problem Relation Age of Onset   Diabetes Father    Heart failure Father    Hypertension Father    Hyperlipidemia Father    Colon cancer Neg Hx      Prior to Admission medications   Medication Sig Start Date End Date Taking? Authorizing Provider  atenolol (TENORMIN) 25 MG tablet Take by mouth daily.    [provider]  clonazePAM (KLONOPIN) 0.5 MG tablet Take 0.5 mg 2 (two) times daily by mouth.    [provider]  ibuprofen (ADVIL,MOTRIN) 600 MG tablet Take 800 mg by mouth every 6 (six) hours as needed.    [provider]  lisinopril (PRINIVIL,ZESTRIL) 10 MG tablet Take 10 mg by mouth daily.    [provider]  loratadine (CLARITIN) 10 MG tablet Take 10 mg by mouth daily.    [provider]  meclizine (ANTIVERT) 25 MG tablet Take 25 mg by mouth 3 (three) times daily as needed for dizziness.    [provider]  metFORMIN (GLUCOPHAGE) 500 MG tablet Take 500 mg by mouth daily with breakfast.    [provider]  methocarbamol (ROBAXIN) 500 MG tablet Take 1,000 mg 3 (three) times daily by mouth.    [provider]  omeprazole (PRILOSEC) 20 MG capsule Take 20 mg by mouth daily.    [provider]  pravastatin (PRAVACHOL) 20 MG tablet Take 20 mg by mouth daily.    [provider]    Physical Exam: Vitals:   04/26/21 2345 04/27/21 0000 04/27/21 0015 04/27/21 0115  BP: (!) 150/52 (!) 143/54 135/63   Pulse: 88 82 87 78  Resp: 18 18 (!) 21 17  Temp:      TempSrc:      SpO2: 100% 100% 100% 100%    Constitutional: NAD, calm, comfortable Vitals:   04/26/21 2345 04/27/21 0000 04/27/21 0015 04/27/21 0115  BP: (!) 150/52 (!) 143/54 135/63    Pulse: 88 82 87 78  Resp: 18 18 (!) 21 17  Temp:      TempSrc:      SpO2: 100% 100% 100% 100%   General: WDWN, Alert and oriented x3.  Eyes: EOMI, PERRL, conjunctivae normal.  Sclera nonicteric HENT:  [place]/AT, external ears normal.  Nares patent without epistasis.  Mucous membranes are moist.  Neck: Soft, normal range of motion, supple, no masses, Trachea midline Respiratory: clear to auscultation bilaterally, no wheezing, no crackles. Normal respiratory effort. No accessory muscle use.  Cardiovascular: Regular rate and rhythm, no murmurs / rubs / gallops. No extremity edema.  Abdomen: Soft, no tenderness, nondistended, no rebound or guarding.  No masses palpated. Obese. Bowel sounds normoactive Musculoskeletal: FROM. no cyanosis. No joint deformity upper and lower extremities.  Normal muscle tone.  Skin: Warm, dry, intact no rashes, lesions, ulcers. No induration Neurologic: CN 2-12 grossly intact.  Normal speech.  Sensation intact  to touch. Strength 5/5 in all extremities.   Psychiatric: Normal judgment and insight.  Normal mood.    Labs on Admission: I have personally reviewed following labs and imaging studies  CBC: Recent Labs  Lab 04/26/21 1719  WBC 14.1*  HGB 11.8*  HCT 37.1  MCV 94.6  PLT 264    Basic Metabolic Panel: Recent Labs  Lab 04/26/21 1719  NA 139  K 3.2*  CL 106  CO2 24  GLUCOSE 124*  BUN 14  CREATININE 0.60  CALCIUM 9.4    GFR: CrCl cannot be calculated (Unknown ideal weight.).  Liver Function Tests: No results for input(s): AST, ALT, ALKPHOS, BILITOT, PROT, ALBUMIN in the last 168 hours.  Urine analysis: No results found for: COLORURINE, APPEARANCEUR, LABSPEC, PHURINE, GLUCOSEU, HGBUR, BILIRUBINUR, KETONESUR, PROTEINUR, UROBILINOGEN, NITRITE, LEUKOCYTESUR  Radiological Exams on Admission: DG Chest 2 View  Result Date: 04/26/2021 CLINICAL DATA:  Chest pain. EXAM: CHEST - 2 VIEW COMPARISON:  May 11, 2018. FINDINGS: The heart size and  mediastinal contours are within normal limits. Both lungs are clear. The visualized skeletal structures are unremarkable. IMPRESSION: No active cardiopulmonary disease. Electronically Signed   By: Lupita Raider M.D.   On: 04/26/2021 18:22   CT Angio Chest PE W and/or Wo Contrast  Result Date: 04/27/2021 CLINICAL DATA:  Chest pain, evaluate for PE EXAM: CT ANGIOGRAPHY CHEST WITH CONTRAST TECHNIQUE: Multidetector CT imaging of the chest was performed using the standard protocol during bolus administration of intravenous contrast. Multiplanar CT image reconstructions and MIPs were obtained to evaluate the vascular anatomy. CONTRAST:  62mL OMNIPAQUE IOHEXOL 350 MG/ML SOLN COMPARISON:  Chest radiograph dated 04/26/2021 FINDINGS: Cardiovascular: Satisfactory opacification of the bilateral pulmonary arteries to the segmental level. No evidence of pulmonary embolism. Although not tailored for evaluation of the thoracic aorta, there is no evidence of thoracic aortic aneurysm or dissection. The heart is normal in size.  No pericardial effusion. Mediastinum/Nodes: No suspicious mediastinal lymphadenopathy. Visualized thyroid is unremarkable. Lungs/Pleura: Evaluation lung parenchyma is constrained by respiratory motion. Minimal left basilar atelectasis.  No focal consolidation. No suspicious pulmonary nodules. No pleural effusion or pneumothorax. Upper Abdomen: Visualized upper abdomen is grossly unremarkable. Musculoskeletal: Visualized osseous structures are within normal limits. Review of the MIP images confirms the above findings. IMPRESSION: No evidence of pulmonary embolism. Negative CT chest. Electronically Signed   By: Charline Bills M.D.   On: 04/27/2021 00:50    EKG: Independently reviewed.  EKG shows normal sinus rhythm with nonspecific ST changes.  QTC prolonged at 503  Assessment/Plan Principal Problem:   Chest pain Ms. Mars placed on cardiac telemetry for observation for atypical chest pain.   Obtain serial troponin levels as had mild elevation in troponin levels in ER. Cardiology consulted by ER physician and will see pt.  Antiplatelet therapy with aspirin daily.  Check lipid panel.  Continue statin therapy.  Monitor blood pressure.  Nitroglycerin as needed. Supplemental oxygen as needed to maintain O2 sat between 92-96%  Active Problems:   Elevated troponin Obtain serial troponin levels to cycle overnight.     Essential hypertension Continue atenolol, lisinopril. Monitor Blood pressure.     Diabetes mellitus type 2 in obese Metformin held for 48 hours after receiving IV contrast for CTA Chest. Monitor blood sugars with meals and bedtimes. Corrective insulin ordered for glycemic control. Check HgbA1c    Prolonged QT interval Avoid medications or could further prolong QT interval    Hypokalemia Check magnesium level. K-dur provided.  DVT prophylaxis: Lovenox for DVT prophylaxis Code Status:   Full code Family Communication:  Diagnosis plan discussed with patient.  Patient verbalized understanding agrees with plan.  Other recommendations to follow as clinical indicated Disposition Plan:   Patient is from:  Home  Anticipated DC to:  Home  Anticipated DC date:  Anticipate less than 2 midnight stay  Anticipated DC barriers: No barriers to discharge identified at this time  Consults called:  Cardiology consulted by ER physician and will see patient in morning Admission status:  Observation   Carlton AdamBradley S Liviana Mills MD [facility]  How to contact the [facility] Attending or Consulting provider 7A - 7P or covering provider during after hours 7P -7A, for this patient?   Check the care team in [facility] and look for a) attending/consulting [facility] provider listed and b) the [facility] team listed Log into [email or web address] and use [place]'s universal password to access. If you do not have the password, please contact the hospital operator. Locate the [facility] provider you are looking for under  [facility] and page to a number that you can be directly reached. If you still have difficulty reaching the provider, please page the [facility] (Director on Call) for the Hospitalists listed on amion for assistance.  04/27/2021, 1:31 AM

## 2021-04-27 NOTE — ED Notes (Addendum)
Pt requesting something other than oxycodone for pain as it did not help before. Admitting MD and Cardiology messaged. Additionally per cardiology note pt is to be started on heparin drip but no order in. Admitting MD and Cardiology made aware. Awaiting order at this time.

## 2021-04-27 NOTE — Plan of Care (Signed)
Follow-up appointment arranged with cardiology 06/05/2021, see AVS for info.

## 2021-04-27 NOTE — Progress Notes (Signed)
ANTICOAGULATION CONSULT NOTE - Initial Consult  Pharmacy Consult for Heparin  Indication: chest pain/ACS  Allergies  Allergen Reactions   Aspirin Palpitations     Vital Signs: Temp: 98.6 F (37 C) (08/12 0251) Temp Source: Oral (08/12 0251) BP: 119/67 (08/12 0245) Pulse Rate: 71 (08/12 0330)  Labs: Recent Labs    04/26/21 1719 04/26/21 1946 04/26/21 2129 04/27/21 0110 04/27/21 0150  HGB 11.8*  --   --   --  11.2*  HCT 37.1  --   --   --  33.9*  PLT 264  --   --   --  257  CREATININE 0.60  --   --   --  0.52  TROPONINIHS 8   < > 37* 27* 26*   < > = values in this interval not displayed.    CrCl cannot be calculated (Unknown ideal weight.).    Assessment: 60 y/o F with chest pain and mildly elevated troponin. Starting heparin per pharmacy with plans for possible stress test vs cath. Hgb 11.2. Renal function good. PTA meds reviewed.   Goal of Therapy:  Heparin level 0.3-0.7 units/ml Monitor platelets by anticoagulation protocol: Yes   Plan:  Heparin 4000 units BOLUS Start heparin drip at 900 units/hr 1300 Heparin level Daily CBC/Heparin level Monitor for bleeding  Abran Duke, PharmD, BCPS Clinical Pharmacist Phone: [phone]

## 2021-04-27 NOTE — Progress Notes (Signed)
Pt admitted by Dr Rachael Darby earlier this am.  Please see note for detailed H&P.   Desiree Mckinney is a 60 y.o. female with medical history significant for DMT2, HTN, HLD, obseity who presents for evaluation of chest pain.  She reports that yesterday while she was cooking dinner she developed a right-sided sharp chest pain that she rated as a 10 out of 10. Cardiology consulted and she underwent stress test showing low risk.  Echocardiogram reviewed.  Pt reports persistent chest pain, with tenderness to palpation.  Probably costochondritis.  Will add toradol, ibuprofen, protonix and add clonazepam for possible panic attacks.  Discussed with the patient, will monitor her overnight due to persistent pain .    Kathlen Mody , MD

## 2021-04-27 NOTE — Progress Notes (Signed)
  Echocardiogram 2D Echocardiogram has been performed.  Desiree Mckinney 04/27/2021, 9:46 AM

## 2021-04-27 NOTE — ED Notes (Signed)
Cardiology paged about heparin order

## 2021-04-27 NOTE — ED Notes (Signed)
Pt to stress test

## 2021-04-27 NOTE — Consult Note (Addendum)
Cardiology Consultation:   Patient ID: Desiree CostainCarolyn D Comer MRN: [id number]; DOB: 02/25/1961  Admit date: 04/26/2021 Date of Consult: 04/27/2021  PCP:  Valerie RoysBrown, Nykedtra Martin, FNP   [facility] Providers Cardiologist:  New to [facility]   Patient Profile:   Desiree Mckinney is a 60 y.o. female with a hx of HTN, dyslipidemia, DM2 and chronic leg pain due to meniscus tear who is being seen 04/27/2021 for the evaluation of CP and minimally abnormal troponin at the request of [facility] (Dr. Rosalia Hammersay).  History of Present Illness:   Desiree Mckinney has the above medical history, including several risk factors for CAD, and [facility] this [facility] after sudden onset of atypical CP. Pt states she was cooking dinner when she suddenly noted 10/10 R-sided sharp CP. Onset was around 4pm earlier today and has been constant for at least 8 hours upon my exam. When I spoke with her, she told me the pain was down to 7/10 but not alleviated by NTG or ASA. She does have costochondritis and states her chest hurts all the time, but that this pain was more severe than usual. She states the pain is worse with movement and very positional. It is reproducible with palpation. No associated diaphoresis, nausea, or SOB. Interestingly, she has intermittent BBB noted on EKG. Initial EKG showed LBBB pattern, but while I was examining the patient, she was in NSR with narrow QRS. After a few minutes, she began having NSR with widened QRS every other beat, then back to wide-complex ultimately with HR in the low 100s. Her troponins have been minimally elevated thus far, 8-->30-->37. PE protocol CTA ruled out PE.   Past Medical History:  Diagnosis Date   Anxiety    Arthritis    Diabetes (HCC)    GERD (gastroesophageal reflux disease)    Hyperlipidemia    Hypertension    MMT (medial meniscus tear)    left   Seasonal allergies    Sleep apnea    does not use CPAP or oral appliance    Past Surgical  History:  Procedure Laterality Date   ceasarean section  1993   CHONDROPLASTY Right 07/16/2018   Procedure: CHONDROPLASTY;  Surgeon: Bjorn PippinVarkey, Dax T, MD;  Location: [facility];  Service: Orthopedics;  Laterality: Right;   KNEE ARTHROSCOPY WITH MEDIAL MENISECTOMY Right 07/16/2018   Procedure: RIGHT ARTHROSCOPY KNEE CHONDROPLASTY, MEDIALMENISCECTOMY;  Surgeon: Bjorn PippinVarkey, Dax T, MD;  Location: [facility];  Service: Orthopedics;  Laterality: Right;   SPLENECTOMY  childhood     Home Medications:  Prior to Admission medications   Medication Sig Start Date End Date Taking? Authorizing Provider  atenolol (TENORMIN) 25 MG tablet Take by mouth daily.    [provider]  clonazePAM (KLONOPIN) 0.5 MG tablet Take 0.5 mg 2 (two) times daily by mouth.    [provider]  ibuprofen (ADVIL,MOTRIN) 600 MG tablet Take 800 mg by mouth every 6 (six) hours as needed.    [provider]  lisinopril (PRINIVIL,ZESTRIL) 10 MG tablet Take 10 mg by mouth daily.    [provider]  loratadine (CLARITIN) 10 MG tablet Take 10 mg by mouth daily.    [provider]  meclizine (ANTIVERT) 25 MG tablet Take 25 mg by mouth 3 (three) times daily as needed for dizziness.    [provider]  metFORMIN (GLUCOPHAGE) 500 MG tablet Take 500 mg by mouth daily with breakfast.    [provider]  methocarbamol (  ROBAXIN) 500 MG tablet Take 1,000 mg 3 (three) times daily by mouth.    [provider]  omeprazole (PRILOSEC) 20 MG capsule Take 20 mg by mouth daily.    [provider]  pravastatin (PRAVACHOL) 20 MG tablet Take 20 mg by mouth daily.    [provider]    Inpatient Medications: Scheduled Meds:  Continuous Infusions:  PRN Meds:   Allergies:    Allergies  Allergen Reactions   Aspirin Palpitations    Social History:   Social History   Socioeconomic History   Marital status: Widowed    Spouse name:  Not on file   Number of children: Not on file   Years of education: Not on file   Highest education level: Not on file  Occupational History   Not on file  Tobacco Use   Smoking status: Former    Types: Cigarettes    Quit date: 09/16/1984    Years since quitting: 36.6   Smokeless tobacco: Never  Substance and Sexual Activity   Alcohol use: Not Currently   Drug use: No   Sexual activity: Not on file  Other Topics Concern   Not on file  Social History Narrative   Not on file   Social Determinants of Health   Financial Resource Strain: Not on file  Food Insecurity: Not on file  Transportation Needs: Not on file  Physical Activity: Not on file  Stress: Not on file  Social Connections: Not on file  Intimate Partner Violence: Not on file    Family History:    Family History  Problem Relation Age of Onset   Diabetes Father    Heart failure Father    Hypertension Father    Hyperlipidemia Father    Colon cancer Neg Hx      ROS:  Please see the history of present illness.   All other ROS reviewed and negative.     Physical Exam/Data:   Vitals:   04/26/21 2330 04/26/21 2345 04/27/21 0000 04/27/21 0015  BP: (!) 134/48 (!) 150/52 (!) 143/54   Pulse: 76 88 82 87  Resp: 20 18 18  (!) 21  Temp:      TempSrc:      SpO2: 98% 100% 100% 100%   No intake or output data in the 24 hours ending 04/27/21 0107 Last 3 Weights 03/13/2021 11/08/2019 07/16/2018  Weight (lbs) 215 lb 206 lb 208 lb 12.4 oz  Weight (kg) 97.523 kg 93.441 kg 94.7 kg     There is no height or weight on file to calculate BMI.  General:  Well nourished, well developed, in no acute distress HEENT: normal Lymph: no adenopathy Neck: no JVD Endocrine:  No thryomegaly Vascular: No carotid bruits; DP pulses 2+ bilaterally  Cardiac:  normal S1, S2; RRR; no murmur  Lungs:  clear to auscultation bilaterally, no wheezing, rhonchi or rales  Abd: soft, nontender, no hepatomegaly  Ext: no edema Musculoskeletal:  No  deformities Skin: warm and dry  Neuro:  no focal abnormalities noted Psych:  Normal affect   EKG:  The EKG was personally reviewed and demonstrates:  NSR with LBBB (initial) Telemetry:  Telemetry was personally reviewed and demonstrates:  NSR; initially w/ LBBB, then narrow-complex, then alternating wide-narrow complex, then back to wide-complex  Relevant CV Studies:   Laboratory Data:  High Sensitivity Troponin:   Recent Labs  Lab 04/26/21 1719 04/26/21 1946 04/26/21 2129  TROPONINIHS 8 30* 37*     Chemistry Recent Labs  Lab 04/26/21 1719  NA 139  K 3.2*  CL 106  CO2 24  GLUCOSE 124*  BUN 14  CREATININE 0.60  CALCIUM 9.4  GFRNONAA >60  ANIONGAP 9    No results for input(s): PROT, ALBUMIN, AST, ALT, ALKPHOS, BILITOT in the last 168 hours. Hematology Recent Labs  Lab 04/26/21 1719  WBC 14.1*  RBC 3.92  HGB 11.8*  HCT 37.1  MCV 94.6  MCH 30.1  MCHC 31.8  RDW 14.3  PLT 264   BNPNo results for input(s): BNP, PROBNP in the last 168 hours.  DDimer No results for input(s): DDIMER in the last 168 hours.   Radiology/Studies:  DG Chest 2 View  Result Date: 04/26/2021 CLINICAL DATA:  Chest pain. EXAM: CHEST - 2 VIEW COMPARISON:  May 11, 2018. FINDINGS: The heart size and mediastinal contours are within normal limits. Both lungs are clear. The visualized skeletal structures are unremarkable. IMPRESSION: No active cardiopulmonary disease. Electronically Signed   By: Lupita Raider M.D.   On: 04/26/2021 18:22   CT Angio Chest PE W and/or Wo Contrast  Result Date: 04/27/2021 CLINICAL DATA:  Chest pain, evaluate for PE EXAM: CT ANGIOGRAPHY CHEST WITH CONTRAST TECHNIQUE: Multidetector CT imaging of the chest was performed using the standard protocol during bolus administration of intravenous contrast. Multiplanar CT image reconstructions and MIPs were obtained to evaluate the vascular anatomy. CONTRAST:  35mL OMNIPAQUE IOHEXOL 350 MG/ML SOLN COMPARISON:  Chest  radiograph dated 04/26/2021 FINDINGS: Cardiovascular: Satisfactory opacification of the bilateral pulmonary arteries to the segmental level. No evidence of pulmonary embolism. Although not tailored for evaluation of the thoracic aorta, there is no evidence of thoracic aortic aneurysm or dissection. The heart is normal in size.  No pericardial effusion. Mediastinum/Nodes: No suspicious mediastinal lymphadenopathy. Visualized thyroid is unremarkable. Lungs/Pleura: Evaluation lung parenchyma is constrained by respiratory motion. Minimal left basilar atelectasis.  No focal consolidation. No suspicious pulmonary nodules. No pleural effusion or pneumothorax. Upper Abdomen: Visualized upper abdomen is grossly unremarkable. Musculoskeletal: Visualized osseous structures are within normal limits. Review of the MIP images confirms the above findings. IMPRESSION: No evidence of pulmonary embolism. Negative CT chest. Electronically Signed   By: Charline Bills M.D.   On: 04/27/2021 00:50     Assessment and Plan:   CP: very atypical. PE has been ruled out. HS Troponin is minimally elevated even after 8 hours of constant pain. Recommend cont to cycle troponins to get a better feel for the trend. She has LBBB that seems intermittent, possibly rate-related. Some concern that new LBBB might represent acute MI, but the low troponin argues against that at least for now. If the troponin does not rise appreciably by the AM, could consider nuc stress testing for further evaluation. If it does rise, LHC might be more appropriate. Would go ahead w/ heparin IV gtt in the interim. Please keep NPO after MN. Will plan for TTE tomorrow as well. 2. Abn EKG: see above. Intermittent IVCD; recommend cont to cycle troponins as above. 3. HTN/dyslipidemia/DM2: restart home meds; mgmt as per primary team   Risk Assessment/Risk Scores:     HEAR Score (for undifferentiated chest pain):  HEAR Score: 4          For questions or updates,  please contact [facility] Please consult [email or web address] for contact info under    Signed, Precious Reel, MD, [facility] 04/27/2021 1:07 AM  Personally seen and examined. Agree with fellow above with the following comments: Briefly 60 yo F with  HTN with DM, aortic atherosclerosis & HLD and known costochondritis with atypical chest pain Patient notes that the chest pain has returned, that it feels like she has been punched in the chest.  Radiates to the right side Exam notable for no murmurs; exam WNL Labs notable for minimal troponin elevation (< 100 X3) Personally reviewed relevant tests; at 2300 there is evidence of intermittent IVCD (LBBB).  Echo is performed:  preliminary no WMA, preserved EF, mild MR, no significant AI.  CTPE shows no significant CAC and minimal Aortic Atherosclerosis Would recommend  - atypical presentation but with intermittent LBBB with normal LV function would recommend Pharmacological NM Stress testing - discussed risks, benefits, and alternatives of the diagnostic procedure including chest pain, arrhythmia, and death.  Patient amenable for testing; has been NPO  If no perfusion defects may be appropriate for outpatient cardiac prevention and we will work to arrange follow up based on the results  Riley Lam, MD Cardiologist [facility]  [address] [place], [address] [place], [place] [address] [phone]  9:29 AM   Update from Stress Test: During adenosine administration patient has intermittent LBBB followed by WCT ~ 110s with LBBB appearance.  No AV dyssynchrony but with baseline artifact:  Query Sinus tachy with aberrancy vs SVT with aberrancy ( LB-oid VT at 110 seems clinical atypical).   Resolved with time.  Per patient report had CP during administration.  Prior to post scan no CP and felt well.  If no ischemic cause of intermittent LBBB; CMR can be considered as outpatient.  Riley Lam, MD Cardiologist [facility]  [address] [place], [address] [place], [place] [address] [phone]  3:06 PM

## 2021-04-27 NOTE — Progress Notes (Signed)
Desiree Mckinney presented for a nuclear stress test today.  No immediate complications. Went into LBBB immediately after insertion of lexiscan.  Stress imaging is pending at this time.   Preliminary EKG findings may be listed in the chart, but the stress test result will not be finalized until perfusion imaging is complete.   [place], [place]  04/27/2021 2:27 PM

## 2021-04-28 DIAGNOSIS — E669 Obesity, unspecified: Secondary | ICD-10-CM | POA: Diagnosis not present

## 2021-04-28 DIAGNOSIS — I10 Essential (primary) hypertension: Secondary | ICD-10-CM | POA: Diagnosis not present

## 2021-04-28 DIAGNOSIS — R0789 Other chest pain: Secondary | ICD-10-CM | POA: Diagnosis not present

## 2021-04-28 DIAGNOSIS — E1169 Type 2 diabetes mellitus with other specified complication: Secondary | ICD-10-CM

## 2021-04-28 LAB — GLUCOSE, CAPILLARY
Glucose-Capillary: 124 mg/dL — ABNORMAL HIGH (ref 70–99)
Glucose-Capillary: 109 mg/dL — ABNORMAL HIGH (ref 70–99)

## 2021-04-28 MED ORDER — OMEPRAZOLE 20 MG PO CPDR: 40.0000 mg | DELAYED_RELEASE_CAPSULE | Freq: Every day | ORAL | 1 refills | Status: DC

## 2021-04-28 MED ORDER — LISINOPRIL 10 MG PO TABS: 10.0000 mg | ORAL_TABLET | Freq: Every day | ORAL | 1 refills | Status: AC

## 2021-04-28 MED ORDER — KETOROLAC TROMETHAMINE 15 MG/ML IJ SOLN
15.0000 mg | Freq: Once | INTRAMUSCULAR | Status: AC
  Administered 2021-04-28: 15 mg via INTRAVENOUS
  Filled 2021-04-28: qty 1

## 2021-04-28 NOTE — Progress Notes (Signed)
Progress Note  Patient Name: Davene CostainCarolyn D Attaway Date of Encounter: 04/28/2021  [facility] Cardiologist: Christell ConstantMahesh A Chandrasekhar, MD   Subjective   No acute events overnight. Chest pain much improved after toradol yesterday. Has a history of costochondritis. Reviewed the results of her stress test and echo from yesterday.  Inpatient Medications    Scheduled Meds:  atenolol  25 mg Oral Daily   insulin aspart  0-15 Units Subcutaneous TID WC   insulin aspart  0-5 Units Subcutaneous QHS   lisinopril  10 mg Oral Daily   pantoprazole  40 mg Oral Daily   potassium chloride  40 mEq Oral Once   pravastatin  20 mg Oral Daily   sodium chloride flush  3 mL Intravenous Q12H   Continuous Infusions:  sodium chloride     PRN Meds: sodium chloride, acetaminophen **OR** acetaminophen, clonazePAM, ibuprofen, sodium chloride flush   Vital Signs    Vitals:   04/27/21 2047 04/27/21 2117 04/28/21 0502 04/28/21 0828  BP: 123/62  (!) 105/52 123/66  Pulse: 82  73 92  Resp: 18  16 19   Temp: 98.2 F (36.8 C)  98.2 F (36.8 C)   TempSrc: Oral  Oral   SpO2: 93%  97% 99%  Weight:   100.1 kg   Height:  5\' 5"  (1.651 m)      Intake/Output Summary (Last 24 hours) at 04/28/2021 0927 Last data filed at 04/28/2021 0828 Gross per 24 hour  Intake 658.02 ml  Output --  Net 658.02 ml   Last 3 Weights 04/28/2021 03/13/2021 11/08/2019  Weight (lbs) 220 lb 9.6 oz 215 lb 206 lb  Weight (kg) 100.064 kg 97.523 kg 93.441 kg      Telemetry    NSR, has remained narrow complex - Personally Reviewed  ECG    No new since 04/26/21 - Personally Reviewed  Physical Exam   GEN: No acute distress.   Neck: No JVD Cardiac: RRR, no murmurs, rubs, or gallops.  Respiratory: Clear to auscultation bilaterally. GI: Soft, nontender, non-distended  MS: No edema; No deformity. Neuro:  Nonfocal  Psych: Normal affect   Labs    High Sensitivity Troponin:   Recent Labs  Lab 04/26/21 2129 04/27/21 0110  04/27/21 0150 04/27/21 0310 04/27/21 1536  TROPONINIHS 37* 27* 26* 22* 17      Chemistry Recent Labs  Lab 04/26/21 1719 04/27/21 0150 04/27/21 0310  NA 139  --  139  K 3.2*  --  3.5  CL 106  --  108  CO2 24  --  25  GLUCOSE 124*  --  134*  BUN 14  --  10  CREATININE 0.60 0.52 0.58  CALCIUM 9.4  --  8.8*  GFRNONAA >60 >60 >60  ANIONGAP 9  --  6     Hematology Recent Labs  Lab 04/26/21 1719 04/27/21 0150  WBC 14.1* 11.5*  RBC 3.92 3.65*  HGB 11.8* 11.2*  HCT 37.1 33.9*  MCV 94.6 92.9  MCH 30.1 30.7  MCHC 31.8 33.0  RDW 14.3 14.3  PLT 264 257    BNPNo results for input(s): BNP, PROBNP in the last 168 hours.   DDimer No results for input(s): DDIMER in the last 168 hours.   Radiology    DG Chest 2 View  Result Date: 04/26/2021 CLINICAL DATA:  Chest pain. EXAM: CHEST - 2 VIEW COMPARISON:  May 11, 2018. FINDINGS: The heart size and mediastinal contours are within normal limits. Both lungs are clear. The visualized  skeletal structures are unremarkable. IMPRESSION: No active cardiopulmonary disease. Electronically Signed   By: Lupita Raider M.D.   On: 04/26/2021 18:22   CT Angio Chest PE W and/or Wo Contrast  Result Date: 04/27/2021 CLINICAL DATA:  Chest pain, evaluate for PE EXAM: CT ANGIOGRAPHY CHEST WITH CONTRAST TECHNIQUE: Multidetector CT imaging of the chest was performed using the standard protocol during bolus administration of intravenous contrast. Multiplanar CT image reconstructions and MIPs were obtained to evaluate the vascular anatomy. CONTRAST:  65mL OMNIPAQUE IOHEXOL 350 MG/ML SOLN COMPARISON:  Chest radiograph dated 04/26/2021 FINDINGS: Cardiovascular: Satisfactory opacification of the bilateral pulmonary arteries to the segmental level. No evidence of pulmonary embolism. Although not tailored for evaluation of the thoracic aorta, there is no evidence of thoracic aortic aneurysm or dissection. The heart is normal in size.  No pericardial effusion.  Mediastinum/Nodes: No suspicious mediastinal lymphadenopathy. Visualized thyroid is unremarkable. Lungs/Pleura: Evaluation lung parenchyma is constrained by respiratory motion. Minimal left basilar atelectasis.  No focal consolidation. No suspicious pulmonary nodules. No pleural effusion or pneumothorax. Upper Abdomen: Visualized upper abdomen is grossly unremarkable. Musculoskeletal: Visualized osseous structures are within normal limits. Review of the MIP images confirms the above findings. IMPRESSION: No evidence of pulmonary embolism. Negative CT chest. Electronically Signed   By: Charline Bills M.D.   On: 04/27/2021 00:50   NM Myocar Multi W/Spect W/Wall Motion / EF  Result Date: 04/27/2021 CLINICAL DATA:  Chest discomfort.  Negative CTA. EXAM: MYOCARDIAL IMAGING WITH SPECT (REST AND PHARMACOLOGIC-STRESS) GATED LEFT VENTRICULAR WALL MOTION STUDY LEFT VENTRICULAR EJECTION FRACTION TECHNIQUE: Standard myocardial SPECT imaging was performed after resting intravenous injection of 10 mCi Tc-67m Myoview. Subsequently, intravenous infusion of Lexiscan was performed under the supervision of the Cardiology staff. At peak effect of the drug, 27.7 mCi Tc-30m Myoview was injected intravenously and standard myocardial SPECT imaging was performed. Quantitative gated imaging was also performed to evaluate left ventricular wall motion, and estimate left ventricular ejection fraction. COMPARISON:  None. FINDINGS: Perfusion: Small region of moderately decreased activity involving the inferoapical aspect of the left ventricular myocardium on both rest and stress components of the study. No reversible component to suggest ischemia. Wall Motion: Mild inferior wall hypokinesis. No left ventricular dilation. Left Ventricular Ejection Fraction: 68 % End diastolic volume 70 ml End systolic volume 22 ml IMPRESSION: 1. Negative for ischemia. Small fixed inferoapical defect suggesting scar versus attenuation. 2. Mild inferior wall  hypokinesis without LVE. 3. Left ventricular ejection fraction 68% 4. Non invasive risk stratification*: Low *2012 Appropriate Use Criteria for Coronary Revascularization Focused Update: J Am Coll Cardiol. 2012;59(9):857-881. Http://content.dementiazones.com.aspx?articleid=[email or web address] Electronically Signed   By: Corlis Leak M.D.   On: 04/27/2021 16:34   ECHOCARDIOGRAM COMPLETE  Result Date: 04/27/2021    ECHOCARDIOGRAM REPORT   Patient Name:   JALECIA LEON Date of Exam: 04/27/2021 Medical Rec #:  [id number]          Height:       65.0 in Accession #:    [id number]         Weight:       215.0 lb Date of Birth:  Mar 05, 1961          BSA:          2.040 m Patient Age:    59 years           BP:           99/73 mmHg Patient Gender: F  HR:           96 bpm. Exam Location:  Inpatient Procedure: 2D Echo, Cardiac Doppler and Color Doppler Indications:    R07.9* Chest pain, unspecified  History:        Patient has no prior history of Echocardiogram examinations.                 Risk Factors:Hypertension, Diabetes, Dyslipidemia, Sleep Apnea                 and GERD.  Sonographer:    Tiffany Dance RVT Referring Phys: [id number] STEPHANIE FALK MARTIN IMPRESSIONS  1. Left ventricular ejection fraction, by estimation, is 60 to 65%. The left ventricle has normal function. The left ventricle has no regional wall motion abnormalities. Left ventricular diastolic parameters are consistent with Grade I diastolic dysfunction (impaired relaxation).  2. Right ventricular systolic function is normal. The right ventricular size is normal.  3. The mitral valve is normal in structure. Trivial mitral valve regurgitation. No evidence of mitral stenosis.  4. The aortic valve is normal in structure. Aortic valve regurgitation is not visualized. No aortic stenosis is present.  5. The inferior vena cava is normal in size with greater than 50% respiratory variability, suggesting right atrial pressure of 3 mmHg. FINDINGS  Left  Ventricle: Left ventricular ejection fraction, by estimation, is 60 to 65%. The left ventricle has normal function. The left ventricle has no regional wall motion abnormalities. The left ventricular internal cavity size was normal in size. There is  no left ventricular hypertrophy. Left ventricular diastolic parameters are consistent with Grade I diastolic dysfunction (impaired relaxation). Right Ventricle: The right ventricular size is normal. No increase in right ventricular wall thickness. Right ventricular systolic function is normal. Left Atrium: Left atrial size was normal in size. Right Atrium: Right atrial size was normal in size. Pericardium: There is no evidence of pericardial effusion. Mitral Valve: The mitral valve is normal in structure. Trivial mitral valve regurgitation. No evidence of mitral valve stenosis. Tricuspid Valve: The tricuspid valve is normal in structure. Tricuspid valve regurgitation is not demonstrated. No evidence of tricuspid stenosis. Aortic Valve: The aortic valve is normal in structure. Aortic valve regurgitation is not visualized. No aortic stenosis is present. Pulmonic Valve: The pulmonic valve was normal in structure. Pulmonic valve regurgitation is not visualized. No evidence of pulmonic stenosis. Aorta: The aortic root is normal in size and structure. Venous: The inferior vena cava is normal in size with greater than 50% respiratory variability, suggesting right atrial pressure of 3 mmHg. IAS/Shunts: No atrial level shunt detected by color flow Doppler.  LEFT VENTRICLE PLAX 2D LVIDd:         4.70 cm     Diastology LVIDs:         3.10 cm     LV e' medial:    8.16 cm/s LV PW:         1.00 cm     LV E/e' medial:  10.4 LV IVS:        0.80 cm     LV e' lateral:   9.14 cm/s                            LV E/e' lateral: 9.3  LV Volumes (MOD) LV vol d, MOD A2C: 64.2 ml LV vol d, MOD A4C: 70.5 ml LV vol s, MOD A2C: 20.1 ml LV vol s, MOD A4C: 23.5 ml LV SV  MOD A2C:     44.1 ml LV SV MOD  A4C:     70.5 ml LV SV MOD BP:      45.6 ml RIGHT VENTRICLE             IVC RV Basal diam:  2.90 cm     IVC diam: 1.20 cm RV S prime:     13.40 cm/s TAPSE (M-mode): 1.8 cm LEFT ATRIUM             Index       RIGHT ATRIUM          Index LA diam:        3.90 cm 1.91 cm/m  RA Area:     9.45 cm LA Vol (A2C):   60.4 ml 29.61 ml/m RA Volume:   16.70 ml 8.19 ml/m LA Vol (A4C):   37.2 ml 18.24 ml/m LA Biplane Vol: 48.9 ml 23.97 ml/m  AORTIC VALVE LVOT Vmax:   100.20 cm/s LVOT Vmean:  66.300 cm/s LVOT VTI:    0.206 m  AORTA Ao Asc diam: 2.70 cm MITRAL VALVE MV Area (PHT): 3.54 cm    SHUNTS MV Decel Time: 214 msec    Systemic VTI: 0.21 m MV E velocity: 84.60 cm/s MV A velocity: 77.10 cm/s MV E/A ratio:  1.10 Donato Schultz MD Electronically signed by Donato Schultz MD Signature Date/Time: 04/27/2021/12:26:55 PM    Final     Cardiac Studies   Nuclear stress 04/27/21 1. Negative for ischemia. Small fixed inferoapical defect suggesting scar versus attenuation.   2. Mild inferior wall hypokinesis without LVE.   3. Left ventricular ejection fraction 68%   4. Non invasive risk stratification*: Low  Echo 04/27/21  1. Left ventricular ejection fraction, by estimation, is 60 to 65%. The  left ventricle has normal function. The left ventricle has no regional  wall motion abnormalities. Left ventricular diastolic parameters are  consistent with Grade I diastolic  dysfunction (impaired relaxation).   2. Right ventricular systolic function is normal. The right ventricular  size is normal.   3. The mitral valve is normal in structure. Trivial mitral valve  regurgitation. No evidence of mitral stenosis.   4. The aortic valve is normal in structure. Aortic valve regurgitation is  not visualized. No aortic stenosis is present.   5. The inferior vena cava is normal in size with greater than 50%  respiratory variability, suggesting right atrial pressure of 3 mmHg.   Patient Profile     60 y.o. female with PMH HTN,  dyslipidemia, type II diabetes who is being seen at the request of Dr. Rosalia Hammers for chest pain.  Assessment & Plan    Chest pain: -stress test low risk, negative for ischemia. Only small inferoapical defect, scar vs. Artifact -normal LVEF on stress test and echo, no wall motion abnormalities on echo -CT negative for PE -given qualities, evaluation, not consistent with cardiac chest pain -hsTn not consistent with ACS  Hypertension Dyslipidemia Type II diabetes -on metformin, lisinopril, pravastatin, insulin, atenolol -management per primary team  [facility] will sign off.   Medication Recommendations:  as currently ordered Other recommendations (labs, testing, etc):  none Follow up as an outpatient:  We will arrange for outpatient follow up with Dr. Izora Ribas or a member of his team  For questions or updates, please contact [facility] Please consult [email or web address] for contact info under        Signed, Jodelle Red, MD  04/28/2021, 9:27 AM

## 2021-04-29 NOTE — Discharge Summary (Signed)
Physician Discharge Summary  NAMI STRAWDER WEX:[id number] DOB: 05-17-1961 DOA: 04/26/2021  PCP: Valerie Roys, FNP  Admit date: 04/26/2021 Discharge date: 04/28/2021  Admitted From: Home.  Disposition:  Home.   Recommendations for Outpatient Follow-up:  Follow up with PCP in 1-2 weeks Please obtain BMP/CBC in one week Please follow up with cardiology as recommended.   Discharge Condition:stable.  CODE STATUS:full code.  Diet recommendation: Heart Healthy   Brief/Interim Summary: Desiree Mckinney is a 60 y.o. female with medical history significant for DMT2, HTN, HLD, obseity who presents for evaluation of chest pain.  She reports that yesterday while she was cooking dinner she developed a right-sided sharp chest pain that she rated as a 10 out of 10. Cardiology consulted and she underwent stress test showing low risk.  Echocardiogram reviewed.  Pt reports persistent chest pain, with tenderness to palpation.   Discharge Diagnoses:  Principal Problem:   Chest pain Active Problems:   Prolonged QT interval   Elevated troponin   Essential hypertension   Diabetes mellitus type 2 in obese (HCC)  Chest pain probably costochondritis Recommend using ibuprofen with protonix.  Low risk study on stress test.  Recommend outpatient follow u with cardiology for recurrent symptoms.    Hypertension; Well controlled.   Body mass index is 36.71 kg/m. Recommend outpatient follow up.    Panic attacks: Resume home clonazepam.    Type 2 DM  CBG (last 3)  Recent Labs    04/27/21 2055 04/28/21 0729 04/28/21 1137  GLUCAP 138* 124* 109*   Resume home meds.   Discharge Instructions  Discharge Instructions     Diet - low sodium heart healthy   Complete by: As directed    Discharge instructions   Complete by: As directed    Please follow up with PCP in one week.   Increase activity slowly   Complete by: As directed       Allergies as of 04/28/2021        Reactions   Aspirin Palpitations        Medication List     TAKE these medications    atenolol 25 MG tablet Commonly known as: TENORMIN Take 25 mg by mouth every evening.   clonazePAM 0.5 MG tablet Commonly known as: KLONOPIN Take 0.5 mg 2 (two) times daily by mouth.   ibuprofen 600 MG tablet Commonly known as: ADVIL Take 800 mg by mouth every 6 (six) hours as needed for moderate pain.   lisinopril 10 MG tablet Commonly known as: ZESTRIL Take 1 tablet (10 mg total) by mouth daily. What changed:  medication strength how much to take   loratadine 10 MG tablet Commonly known as: CLARITIN Take 10 mg by mouth daily as needed for allergies.   metFORMIN 500 MG 24 hr tablet Commonly known as: GLUCOPHAGE-XR Take 500 mg by mouth every evening.   omeprazole 20 MG capsule Commonly known as: PRILOSEC Take 2 capsules (40 mg total) by mouth daily. What changed: how much to take   pravastatin 20 MG tablet Commonly known as: PRAVACHOL Take 20 mg by mouth every evening.        Follow-up Information     Tereso Newcomer T, PA-C Follow up on 06/05/2021.   Specialties: Cardiology, Physician Assistant Why: at 8:45 AM for your post hosptial follow up with cardiology Contact information: [address] [place] [address] [phone]  Allergies  Allergen Reactions   Aspirin Palpitations    Consultations: Cardiology.    Procedures/Studies: DG Chest 2 View  Result Date: 04/26/2021 CLINICAL DATA:  Chest pain. EXAM: CHEST - 2 VIEW COMPARISON:  May 11, 2018. FINDINGS: The heart size and mediastinal contours are within normal limits. Both lungs are clear. The visualized skeletal structures are unremarkable. IMPRESSION: No active cardiopulmonary disease. Electronically Signed   By: Lupita RaiderJames  Green Jr M.D.   On: 04/26/2021 18:22   CT Angio Chest PE W and/or Wo Contrast  Result Date: 04/27/2021 CLINICAL DATA:  Chest pain, evaluate for  PE EXAM: CT ANGIOGRAPHY CHEST WITH CONTRAST TECHNIQUE: Multidetector CT imaging of the chest was performed using the standard protocol during bolus administration of intravenous contrast. Multiplanar CT image reconstructions and MIPs were obtained to evaluate the vascular anatomy. CONTRAST:  75mL OMNIPAQUE IOHEXOL 350 MG/ML SOLN COMPARISON:  Chest radiograph dated 04/26/2021 FINDINGS: Cardiovascular: Satisfactory opacification of the bilateral pulmonary arteries to the segmental level. No evidence of pulmonary embolism. Although not tailored for evaluation of the thoracic aorta, there is no evidence of thoracic aortic aneurysm or dissection. The heart is normal in size.  No pericardial effusion. Mediastinum/Nodes: No suspicious mediastinal lymphadenopathy. Visualized thyroid is unremarkable. Lungs/Pleura: Evaluation lung parenchyma is constrained by respiratory motion. Minimal left basilar atelectasis.  No focal consolidation. No suspicious pulmonary nodules. No pleural effusion or pneumothorax. Upper Abdomen: Visualized upper abdomen is grossly unremarkable. Musculoskeletal: Visualized osseous structures are within normal limits. Review of the MIP images confirms the above findings. IMPRESSION: No evidence of pulmonary embolism. Negative CT chest. Electronically Signed   By: Charline BillsSriyesh  Krishnan M.D.   On: 04/27/2021 00:50   NM Myocar Multi W/Spect W/Wall Motion / EF  Result Date: 04/27/2021 CLINICAL DATA:  Chest discomfort.  Negative CTA. EXAM: MYOCARDIAL IMAGING WITH SPECT (REST AND PHARMACOLOGIC-STRESS) GATED LEFT VENTRICULAR WALL MOTION STUDY LEFT VENTRICULAR EJECTION FRACTION TECHNIQUE: Standard myocardial SPECT imaging was performed after resting intravenous injection of 10 mCi Tc-1554m Myoview. Subsequently, intravenous infusion of Lexiscan was performed under the supervision of the Cardiology staff. At peak effect of the drug, 27.7 mCi Tc-1154m Myoview was injected intravenously and standard myocardial SPECT  imaging was performed. Quantitative gated imaging was also performed to evaluate left ventricular wall motion, and estimate left ventricular ejection fraction. COMPARISON:  None. FINDINGS: Perfusion: Small region of moderately decreased activity involving the inferoapical aspect of the left ventricular myocardium on both rest and stress components of the study. No reversible component to suggest ischemia. Wall Motion: Mild inferior wall hypokinesis. No left ventricular dilation. Left Ventricular Ejection Fraction: 68 % End diastolic volume 70 ml End systolic volume 22 ml IMPRESSION: 1. Negative for ischemia. Small fixed inferoapical defect suggesting scar versus attenuation. 2. Mild inferior wall hypokinesis without LVE. 3. Left ventricular ejection fraction 68% 4. Non invasive risk stratification*: Low *2012 Appropriate Use Criteria for Coronary Revascularization Focused Update: J Am Coll Cardiol. 2012;59(9):857-881. Http://content.dementiazones.comonlinejacc.org/article.aspx?articleid=1201161 Electronically Signed   By: Corlis Leak  Hassell M.D.   On: 04/27/2021 16:34   ECHOCARDIOGRAM COMPLETE  Result Date: 04/27/2021    ECHOCARDIOGRAM REPORT   Patient Name:   Desiree Mckinney Date of Exam: 04/27/2021 Medical Rec #:  [id number]          Height:       65.0 in Accession #:    [id number]         Weight:       215.0 lb Date of Birth:  03/18/1961  BSA:          2.040 m Patient Age:    59 years           BP:           99/73 mmHg Patient Gender: F                  HR:           96 bpm. Exam Location:  Inpatient Procedure: 2D Echo, Cardiac Doppler and Color Doppler Indications:    R07.9* Chest pain, unspecified  History:        Patient has no prior history of Echocardiogram examinations.                 Risk Factors:Hypertension, Diabetes, Dyslipidemia, Sleep Apnea                 and GERD.  Sonographer:    Tiffany Dance RVT Referring Phys: [id number] STEPHANIE FALK MARTIN IMPRESSIONS  1. Left ventricular ejection fraction, by estimation,  is 60 to 65%. The left ventricle has normal function. The left ventricle has no regional wall motion abnormalities. Left ventricular diastolic parameters are consistent with Grade I diastolic dysfunction (impaired relaxation).  2. Right ventricular systolic function is normal. The right ventricular size is normal.  3. The mitral valve is normal in structure. Trivial mitral valve regurgitation. No evidence of mitral stenosis.  4. The aortic valve is normal in structure. Aortic valve regurgitation is not visualized. No aortic stenosis is present.  5. The inferior vena cava is normal in size with greater than 50% respiratory variability, suggesting right atrial pressure of 3 mmHg. FINDINGS  Left Ventricle: Left ventricular ejection fraction, by estimation, is 60 to 65%. The left ventricle has normal function. The left ventricle has no regional wall motion abnormalities. The left ventricular internal cavity size was normal in size. There is  no left ventricular hypertrophy. Left ventricular diastolic parameters are consistent with Grade I diastolic dysfunction (impaired relaxation). Right Ventricle: The right ventricular size is normal. No increase in right ventricular wall thickness. Right ventricular systolic function is normal. Left Atrium: Left atrial size was normal in size. Right Atrium: Right atrial size was normal in size. Pericardium: There is no evidence of pericardial effusion. Mitral Valve: The mitral valve is normal in structure. Trivial mitral valve regurgitation. No evidence of mitral valve stenosis. Tricuspid Valve: The tricuspid valve is normal in structure. Tricuspid valve regurgitation is not demonstrated. No evidence of tricuspid stenosis. Aortic Valve: The aortic valve is normal in structure. Aortic valve regurgitation is not visualized. No aortic stenosis is present. Pulmonic Valve: The pulmonic valve was normal in structure. Pulmonic valve regurgitation is not visualized. No evidence of pulmonic  stenosis. Aorta: The aortic root is normal in size and structure. Venous: The inferior vena cava is normal in size with greater than 50% respiratory variability, suggesting right atrial pressure of 3 mmHg. IAS/Shunts: No atrial level shunt detected by color flow Doppler.  LEFT VENTRICLE PLAX 2D LVIDd:         4.70 cm     Diastology LVIDs:         3.10 cm     LV e' medial:    8.16 cm/s LV PW:         1.00 cm     LV E/e' medial:  10.4 LV IVS:        0.80 cm     LV e' lateral:   9.14 cm/s  LV E/e' lateral: 9.3  LV Volumes (MOD) LV vol d, MOD A2C: 64.2 ml LV vol d, MOD A4C: 70.5 ml LV vol s, MOD A2C: 20.1 ml LV vol s, MOD A4C: 23.5 ml LV SV MOD A2C:     44.1 ml LV SV MOD A4C:     70.5 ml LV SV MOD BP:      45.6 ml RIGHT VENTRICLE             IVC RV Basal diam:  2.90 cm     IVC diam: 1.20 cm RV S prime:     13.40 cm/s TAPSE (M-mode): 1.8 cm LEFT ATRIUM             Index       RIGHT ATRIUM          Index LA diam:        3.90 cm 1.91 cm/m  RA Area:     9.45 cm LA Vol (A2C):   60.4 ml 29.61 ml/m RA Volume:   16.70 ml 8.19 ml/m LA Vol (A4C):   37.2 ml 18.24 ml/m LA Biplane Vol: 48.9 ml 23.97 ml/m  AORTIC VALVE LVOT Vmax:   100.20 cm/s LVOT Vmean:  66.300 cm/s LVOT VTI:    0.206 m  AORTA Ao Asc diam: 2.70 cm MITRAL VALVE MV Area (PHT): 3.54 cm    SHUNTS MV Decel Time: 214 msec    Systemic VTI: 0.21 m MV E velocity: 84.60 cm/s MV A velocity: 77.10 cm/s MV E/A ratio:  1.10 Donato Schultz MD Electronically signed by Donato Schultz MD Signature Date/Time: 04/27/2021/12:26:55 PM    Final       Subjective:  No new complaints.  Discharge Exam: Vitals:   04/28/21 0502 04/28/21 0828  BP: (!) 105/52 123/66  Pulse: 73 92  Resp: 16 19  Temp: 98.2 F (36.8 C)   SpO2: 97% 99%   Vitals:   04/27/21 2047 04/27/21 2117 04/28/21 0502 04/28/21 0828  BP: 123/62  (!) 105/52 123/66  Pulse: 82  73 92  Resp: Temp: 98.2 F (36.8 C)  98.2 F (36.8 C)   TempSrc: Oral  Oral   SpO2: 93%  97%  99%  Weight:   100.1 kg   Height:   (1.651 m)      General: Pt is alert, awake, not in acute distress Cardiovascular: RRR, S1/S2 +, no rubs, no gallops Respiratory: CTA bilaterally, no wheezing, no rhonchi Abdominal: Soft, NT, ND, bowel sounds + Extremities: no edema, no cyanosis    The results of significant diagnostics from this hospitalization (including imaging, microbiology, ancillary and laboratory) are listed below for reference.     Microbiology: Recent Results (from the past 240 hour(s))  SARS CORONAVIRUS 2 (TAT 6-24 HRS) Nasopharyngeal Nasopharyngeal Swab     Status: None   Collection Time: 04/27/21  2:49 AM   Specimen: Nasopharyngeal Swab  Result Value Ref Range Status   SARS Coronavirus 2 NEGATIVE NEGATIVE Final    Comment: (NOTE) SARS-CoV-2 target nucleic acids are NOT DETECTED.  The SARS-CoV-2 RNA is generally detectable in upper and lower respiratory specimens during the acute phase of infection. Negative results do not preclude SARS-CoV-2 infection, do not rule out co-infections with other pathogens, and should not be used as the sole basis for treatment or other patient management decisions. Negative results must be combined with clinical observations, patient history, and epidemiological information. The expected result is Negative.  Fact Sheet for Patients: HairSlick.no  Fact Sheet for Healthcare Providers: quierodirigir.com  This test is not yet approved or cleared by the [place] FDA and  has been authorized for detection and/or diagnosis of SARS-CoV-2 by FDA under an Emergency Use Authorization (EUA). This EUA will remain  in effect (meaning this test can be used) for the duration of the COVID-19 declaration under Se ction 564(b)(1) of the Act, 21 U.S.C. section 360bbb-3(b)(1), unless the authorization is terminated or revoked sooner.  Performed at [facility], [address] [place], [place] [address]      Labs: BNP (last 3 results) No results for input(s): BNP in the last 8760 hours. Basic Metabolic Panel: Recent Labs  Lab 04/26/21 1719 04/27/21 0150 04/27/21 0310  NA 139  --  139  K 3.2*  --  3.5  CL 106  --  108  CO2 24  --  25  GLUCOSE 124*  --  134*  BUN 14  --  10  CREATININE 0.60 0.52 0.58  CALCIUM 9.4  --  8.8*  MG  --  1.6*  --    Liver Function Tests: No results for input(s): AST, ALT, ALKPHOS, BILITOT, PROT, ALBUMIN in the last 168 hours. No results for input(s): LIPASE, AMYLASE in the last 168 hours. No results for input(s): AMMONIA in the last 168 hours. CBC: Recent Labs  Lab 04/26/21 1719 04/27/21 0150  WBC 14.1* 11.5*  HGB 11.8* 11.2*  HCT 37.1 33.9*  MCV 94.6 92.9  PLT 264 257   Cardiac Enzymes: No results for input(s): CKTOTAL, CKMB, CKMBINDEX, TROPONINI in the last 168 hours. BNP: Invalid input(s): POCBNP CBG: Recent Labs  Lab 04/27/21 0818 04/27/21 1605 04/27/21 2055 04/28/21 0729 04/28/21 1137  GLUCAP 117* 157* 138* 124* 109*   D-Dimer No results for input(s): DDIMER in the last 72 hours. Hgb A1c Recent Labs    04/27/21 0150  HGBA1C 6.7*   Lipid Profile No results for input(s): CHOL, HDL, LDLCALC, TRIG, CHOLHDL, LDLDIRECT in the last 72 hours. Thyroid function studies No results for input(s): TSH, T4TOTAL, T3FREE, THYROIDAB in the last 72 hours.  Invalid input(s): FREET3 Anemia work up No results for input(s): VITAMINB12, FOLATE, FERRITIN, TIBC, IRON, RETICCTPCT in the last 72 hours. Urinalysis No results found for: COLORURINE, APPEARANCEUR, LABSPEC, PHURINE, GLUCOSEU, HGBUR, BILIRUBINUR, KETONESUR, PROTEINUR, UROBILINOGEN, NITRITE, LEUKOCYTESUR Sepsis Labs Invalid input(s): PROCALCITONIN,  WBC,  LACTICIDVEN Microbiology Recent Results (from the past 240 hour(s))  SARS CORONAVIRUS 2 (TAT 6-24 HRS) Nasopharyngeal Nasopharyngeal Swab     Status: None   Collection Time: 04/27/21  2:49 AM    Specimen: Nasopharyngeal Swab  Result Value Ref Range Status   SARS Coronavirus 2 NEGATIVE NEGATIVE Final    Comment: (NOTE) SARS-CoV-2 target nucleic acids are NOT DETECTED.  The SARS-CoV-2 RNA is generally detectable in upper and lower respiratory specimens during the acute phase of infection. Negative results do not preclude SARS-CoV-2 infection, do not rule out co-infections with other pathogens, and should not be used as the sole basis for treatment or other patient management decisions. Negative results must be combined with clinical observations, patient history, and epidemiological information. The expected result is Negative.  Fact Sheet for Patients: HairSlick.no  Fact Sheet for Healthcare Providers: quierodirigir.com  This test is not yet approved or cleared by the [place] FDA and  has been authorized for detection and/or diagnosis of SARS-CoV-2 by FDA under an Emergency Use Authorization (EUA). This EUA will remain  in effect (meaning this test can be  used) for the duration of the COVID-19 declaration under Se ction 564(b)(1) of the Act, 21 U.S.C. section 360bbb-3(b)(1), unless the authorization is terminated or revoked sooner.  Performed at [facility], [address], [place], [place] [address]      Time coordinating discharge: 33 minutes.   SIGNED:   Kathlen Mody, MD  [facility] 04/29/2021, 4:47 PM

## 2021-06-04 NOTE — Progress Notes (Signed)
Cardiology Office Note:    Date:  06/05/2021   ID:  KOSISOCHUKWU GOLDBERG, DOB 1961/07/24, MRN [id number]  PCP:  Valerie Roys, FNP  [facility] Providers Cardiologist:  Christell Constant, MD Cardiology APP:  Kennon Rounds     Referring MD: Valerie Roys,*   Chief Complaint:  Hospitalization Follow-up (Admx with chest pain; Echocardiogram/Myoview normal )    Patient Profile:   Desiree Mckinney is a 60 y.o. female with:  Hypertension  Hyperlipidemia  Diabetes mellitus  OSA Echocardiogram 8/22: EF 60-65, no RWMA Myoview 8/22:  Low Risk (no ischemia; small fixed inf-apical defect, EF 68) Aortic atherosclerosis  Intermittent LBBB  Prior CV studies: Echocardiogram 04/27/21 EF 60-65, no RWMA, GR 1 DD, normal RVSF, trivial MR   NM Myocar Multi W/Spect W/Wall Motion / EF 04/27/2021 IMPRESSION: 1. Negative for ischemia. Small fixed inferoapical defect suggesting scar versus attenuation. 2. Mild inferior wall hypokinesis without LVE. 3. Left ventricular ejection fraction 68% 4. Non invasive risk stratification*: Low    History of Present Illness: Ms. Hauter was admitted 8/11-8/13 with chest pain.  Her hs-Trops were minimally elevated without significant trend.  She was noted to have intermittent IVCD (LBBB).  Echocardiogram showed normal EF and normal wall motion.  CT was neg for PE.  A lexiscan Myoview was neg for ischemia.  There was a small inf-apical c/w scar vs attenuation but the study was low risk. The patient's chest pain was felt to be non-cardiac and most c/w costochondritis.   She returns for f/u.  She is here alone.  She was placed on meloxicam by her PCP after discharge and this has helped.  She has had issues with chest wall pain since she was a teenager.  The symptoms have been fairly persistent over the years without significant change.  However, she did have severe pain that prompted her visit to the hospital.  She has not had  significant shortness of breath, orthopnea, syncope, leg edema.  She does have a torn meniscus on the left and needs surgery.    Past Medical History:  Diagnosis Date   Anxiety    Arthritis    Diabetes (HCC)    GERD (gastroesophageal reflux disease)    Hyperlipidemia    Hypertension    MMT (medial meniscus tear)    left   Seasonal allergies    Sleep apnea    does not use CPAP or oral appliance   Current Medications: Current Meds  Medication Sig   atenolol (TENORMIN) 25 MG tablet Take 25 mg by mouth every evening.   clonazePAM (KLONOPIN) 0.5 MG tablet Take 0.5 mg 2 (two) times daily by mouth.   ibuprofen (ADVIL,MOTRIN) 600 MG tablet Take 800 mg by mouth every 6 (six) hours as needed for moderate pain.   lisinopril (ZESTRIL) 10 MG tablet Take 1 tablet (10 mg total) by mouth daily.   loratadine (CLARITIN) 10 MG tablet Take 10 mg by mouth daily as needed for allergies.   meloxicam (MOBIC) 15 MG tablet Take 15 mg by mouth daily.   metFORMIN (GLUCOPHAGE-XR) 500 MG 24 hr tablet Take 500 mg by mouth every evening.   omeprazole (PRILOSEC) 20 MG capsule Take 2 capsules (40 mg total) by mouth daily.   pravastatin (PRAVACHOL) 20 MG tablet Take 20 mg by mouth every evening.    Allergies:   Aspirin   Social History   Tobacco Use   Smoking status: Former    Types: Cigarettes  Quit date: 09/16/1984    Years since quitting: 36.7   Smokeless tobacco: Never  Substance Use Topics   Alcohol use: Not Currently   Drug use: No    Family Hx: The patient's family history includes Diabetes in her father; Heart failure in her father; Hyperlipidemia in her father; Hypertension in her father. There is no history of Colon cancer.  ROS see HPI  EKGs/Labs/Other Test Reviewed:    EKG:  EKG is   ordered today.  The ekg ordered today demonstrates NSR, HR 71, LBBB, QTC 502  Recent Labs: 04/27/2021: BUN 10; Creatinine, Ser 0.58; Hemoglobin 11.2; Magnesium 1.6; Platelets 257; Potassium 3.5; Sodium 139    Recent Lipid Panel No results found for: CHOL, TRIG, HDL, LDLCALC, LDLDIRECT   Risk Assessment/Calculations:          Physical Exam:    VS:  BP 122/68   Pulse 71   Ht 5\' 5"  (1.651 m)   Wt 221 lb 3.2 oz (100.3 kg)   LMP 05/17/2012 (Approximate)   SpO2 98%   BMI 36.81 kg/m     Wt Readings from Last 3 Encounters:  06/05/21 221 lb 3.2 oz (100.3 kg)  04/28/21 220 lb 9.6 oz (100.1 kg)  11/08/19 206 lb (93.4 kg)    Constitutional:      Appearance: Healthy appearance. Not in distress.  Pulmonary:     Effort: Pulmonary effort is normal.     Breath sounds: No wheezing. No rales.  Cardiovascular:     Normal rate. Regular rhythm. Normal S1. Normal S2.      Murmurs: There is no murmur.  Edema:    Peripheral edema absent.  Abdominal:     Palpations: Abdomen is soft.  Musculoskeletal:     Cervical back: Neck supple.     Comments: Left leg: +Knee immobilizer Skin:    General: Skin is warm and dry.  Neurological:     Mental Status: Alert and oriented to person, place and time.     Cranial Nerves: Cranial nerves are intact.        ASSESSMENT & PLAN:   1. Intercostal pain She has had issues with costochondritis for years.  Overall, her symptoms are stable.  She was admitted to the hospital recently with a fairly negative work-up.  I reviewed the findings from her stress test as well as her echocardiogram with her today.  Follow-up in 1 year.    2. Essential hypertension Blood pressure is well controlled on current therapy which includes atenolol, lisinopril.  3. Mixed hyperlipidemia She remains on pravastatin.  This is managed by primary care.  4. LBBB (left bundle branch block) We briefly discussed physiology of her left bundle branch block and its meaning.     Dispo:  No follow-ups on file.   Medication Adjustments/Labs and Tests Ordered: Current medicines are reviewed at length with the patient today.  Concerns regarding medicines are outlined above.  Tests  Ordered: Orders Placed This Encounter  Procedures   EKG 12-Lead    Medication Changes: No orders of the defined types were placed in this encounter.  Signed, 11/10/19, PA-C  06/05/2021 9:04 AM    [facility] [address] [place], [place], [place]  [place] Phone: [phone]; Fax: [phone]

## 2021-06-05 ENCOUNTER — Ambulatory Visit: Payer: 59 | Admitting: Physician Assistant

## 2021-06-05 ENCOUNTER — Other Ambulatory Visit: Payer: Self-pay

## 2021-06-05 ENCOUNTER — Encounter: Payer: Self-pay | Admitting: Physician Assistant

## 2021-06-05 VITALS — BP 122/68 | HR 71 | Ht 65.0 in | Wt 221.2 lb

## 2021-06-05 DIAGNOSIS — R072 Precordial pain: Secondary | ICD-10-CM

## 2021-06-05 DIAGNOSIS — I447 Left bundle-branch block, unspecified: Secondary | ICD-10-CM | POA: Diagnosis not present

## 2021-06-05 DIAGNOSIS — E782 Mixed hyperlipidemia: Secondary | ICD-10-CM

## 2021-06-05 DIAGNOSIS — R0782 Intercostal pain: Secondary | ICD-10-CM

## 2021-06-05 DIAGNOSIS — I10 Essential (primary) hypertension: Secondary | ICD-10-CM | POA: Diagnosis not present

## 2021-06-05 NOTE — Patient Instructions (Addendum)
Medication Instructions:  Your physician recommends that you continue on your current medications as directed. Please refer to the Current Medication list given to you today.  *If you need a refill on your cardiac medications before your next appointment, please call your pharmacy*   Lab Work: None ordered  If you have labs (blood work) drawn today and your tests are completely normal, you will receive your results only by: MyChart Message (if you have MyChart) OR A paper copy in the mail If you have any lab test that is abnormal or we need to change your treatment, we will call you to review the results.   Testing/Procedures: None ordered   Follow-Up: At [facility], you and your health needs are our priority.  As part of our continuing mission to provide you with exceptional heart care, we have created designated Provider Care Teams.  These Care Teams include your primary Cardiologist (physician) and Advanced Practice Providers (APPs -  Physician Assistants and Nurse Practitioners) who all work together to provide you with the care you need, when you need it.  We recommend signing up for the patient portal called "MyChart".  Sign up information is provided on this After Visit Summary.  MyChart is used to connect with patients for Virtual Visits (Telemedicine).  Patients are able to view lab/test results, encounter notes, upcoming appointments, etc.  Non-urgent messages can be sent to your provider as well.   To learn more about what you can do with MyChart, go to [email or web address].    Your next appointment:   12 month(s)  The format for your next appointment:   In Person  Provider:   You may see Mahesh A Chandrasekhar, MD or one of the following Advanced Practice Providers on your designated Care Team:   Dayna Dunn, PA-C Michele Lenze, PA-C   Other Instructions   

## 2021-06-28 ENCOUNTER — Encounter (HOSPITAL_BASED_OUTPATIENT_CLINIC_OR_DEPARTMENT_OTHER): Payer: Self-pay | Admitting: Orthopaedic Surgery

## 2021-06-28 ENCOUNTER — Other Ambulatory Visit: Payer: Self-pay

## 2021-06-30 ENCOUNTER — Ambulatory Visit
Admission: EM | Admit: 2021-06-30 | Discharge: 2021-06-30 | Disposition: A | Discharge status: Home / Self Care | Payer: 59 | Attending: Physician Assistant | Admitting: Physician Assistant

## 2021-06-30 DIAGNOSIS — M25531 Pain in right wrist: Secondary | ICD-10-CM | POA: Diagnosis not present

## 2021-06-30 MED ORDER — PREDNISONE 20 MG PO TABS: 40.0000 mg | ORAL_TABLET | Freq: Every day | ORAL | 0 refills | Status: AC

## 2021-06-30 NOTE — ED Provider Notes (Signed)
[facility]    CSN: [id number] Arrival date & time: 06/30/21  4580      History   Chief Complaint Chief Complaint  Patient presents with   Wrist Pain    Right wrist pain     HPI Desiree Mckinney is a 60 y.o. female.   Patient here today for evaluation of right wrist pain and swelling that she first noticed yesterday as it woke her from sleep.  She denies any known injury but states she has been using that arm for use of a cane due to meniscal tear in her left knee.  She reports she has tried ibuprofen and Mobic for pain without significant relief.  She has not had any injury to her hand or wrist that she is aware of.  She has not had any fever or chills.  Movement makes pain worse.  The history is provided by the patient.  Wrist Pain Pertinent negatives include no shortness of breath.   Past Medical History:  Diagnosis Date   Anxiety    Arthritis    Costochondritis    Diabetes (HCC)    GERD (gastroesophageal reflux disease)    Hyperlipidemia    Hypertension    MMT (medial meniscus tear)    left   Seasonal allergies    Sleep apnea    does not use CPAP or oral appliance    Patient Active Problem List   Diagnosis Date Noted   Chest pain 04/27/2021   Prolonged QT interval 04/27/2021   Elevated troponin 04/27/2021   Essential hypertension 04/27/2021   Diabetes mellitus type 2 in obese (HCC) 04/27/2021   OSA (obstructive sleep apnea) 12/01/2019   Post traumatic stress disorder (PTSD) 11/08/2019   Bruxism (teeth grinding) 11/08/2019   Arthropathy of both temporomandibular joints 11/08/2019   Loud snoring 11/08/2019   Morbid obesity (HCC) 11/08/2019   Psychophysiological insomnia 11/08/2019    Past Surgical History:  Procedure Laterality Date   ceasarean section  1993   CHONDROPLASTY Right 07/16/2018   Procedure: CHONDROPLASTY;  Surgeon: Bjorn Pippin, MD;  Location: [facility];  Service: Orthopedics;  Laterality: Right;   KNEE  ARTHROSCOPY WITH MEDIAL MENISECTOMY Right 07/16/2018   Procedure: RIGHT ARTHROSCOPY KNEE CHONDROPLASTY, MEDIALMENISCECTOMY;  Surgeon: Bjorn Pippin, MD;  Location: [facility];  Service: Orthopedics;  Laterality: Right;   SPLENECTOMY  childhood    OB History   No obstetric history on file.      Home Medications    Prior to Admission medications   Medication Sig Start Date End Date Taking? Authorizing Provider  predniSONE (DELTASONE) 20 MG tablet Take 2 tablets (40 mg total) by mouth daily with breakfast for 5 days. 06/30/21 07/05/21 Yes Tomi Bamberger, PA-C  atenolol (TENORMIN) 25 MG tablet Take 25 mg by mouth every evening.    [provider]  clonazePAM (KLONOPIN) 0.5 MG tablet Take 0.5 mg 2 (two) times daily by mouth.    [provider]  ibuprofen (ADVIL,MOTRIN) 600 MG tablet Take 800 mg by mouth every 6 (six) hours as needed for moderate pain.    [provider]  lisinopril (ZESTRIL) 10 MG tablet Take 1 tablet (10 mg total) by mouth daily. 04/29/21   Kathlen Mody, MD  loratadine (CLARITIN) 10 MG tablet Take 10 mg by mouth daily as needed for allergies.    [provider]  meloxicam (MOBIC) 15 MG tablet Take 15 mg by mouth daily. 05/17/21   [provider]  metFORMIN (GLUCOPHAGE-XR) 500 MG 24 hr tablet Take 500 mg by mouth every evening. 04/10/21   [provider]  omeprazole (PRILOSEC) 20 MG capsule Take 2 capsules (40 mg total) by mouth daily. 04/28/21   Kathlen Mody, MD  pravastatin (PRAVACHOL) 20 MG tablet Take 20 mg by mouth every evening.    [provider]  traMADol (ULTRAM) 50 MG tablet Take by mouth every 6 (six) hours as needed.    [provider]    Family History Family History  Problem Relation Age of Onset   Diabetes Father    Heart failure Father    Hypertension Father    Hyperlipidemia Father    Colon cancer Neg Hx     Social History Social History   Tobacco Use   Smoking  status: Former    Types: Cigarettes    Quit date: 09/16/1984    Years since quitting: 36.8   Smokeless tobacco: Never  Substance Use Topics   Alcohol use: Not Currently   Drug use: No     Allergies   Aspirin   Review of Systems Review of Systems  Constitutional:  Negative for chills and fever.  Eyes:  Negative for discharge and redness.  Respiratory:  Negative for shortness of breath.   Musculoskeletal:  Positive for arthralgias and joint swelling.  Neurological:  Negative for numbness.    Physical Exam Triage Vital Signs ED Triage Vitals  Enc Vitals Group     BP 06/30/21 1009 119/69     Pulse Rate 06/30/21 1009 88     Resp 06/30/21 1009 16     Temp 06/30/21 1009 98.9 F (37.2 C)     Temp Source 06/30/21 1009 Oral     SpO2 06/30/21 1009 96 %     Weight --      Height --      Head Circumference --      Peak Flow --      Pain Score 06/30/21 1008 8     Pain Loc --      Pain Edu? --      Excl. in GC? --    No data found.  Updated Vital Signs BP 119/69 (BP Location: Right Arm)   Pulse 88   Temp 98.9 F (37.2 C) (Oral)   Resp 16   LMP 05/17/2012 (Approximate)   SpO2 96%     Physical Exam Vitals and nursing note reviewed.  Constitutional:      Appearance: Normal appearance.  HENT:     Head: Normocephalic and atraumatic.  Eyes:     Conjunctiva/sclera: Conjunctivae normal.  Cardiovascular:     Rate and Rhythm: Normal rate.  Pulmonary:     Effort: Pulmonary effort is normal.  Musculoskeletal:     Comments: Mild swelling without erythema appreciated to right wrist diffusely.  Tenderness to palpation noted to lateral aspect of breast.  Decreased range of motion due to pain.  Neurological:     Mental Status: She is alert.  Psychiatric:        Mood and Affect: Mood normal.        Behavior: Behavior normal.        Thought Content: Thought content normal.     UC Treatments / Results  Labs (all labs ordered are listed, but only abnormal results are  displayed) Labs Reviewed - No data to display  EKG   Radiology No results found.  Procedures Procedures (including critical care time)  Medications Ordered in UC Medications -  No data to display  Initial Impression / Assessment and Plan / UC Course  I have reviewed the triage vital signs and the nursing notes.  Pertinent labs & imaging results that were available during my care of the patient were reviewed by me and considered in my medical decision making (see chart for details).  Suspect likely tendinitis from use of cane, will treat with steroid burst.  Wrist brace applied in office as well.  Recommended follow-up with her orthopedist if symptoms do not improve or worsen anyway.  Final Clinical Impressions(s) / UC Diagnoses   Final diagnoses:  Right wrist pain     Discharge Instructions      Follow up with ortho if symptoms fail to improve or worsen in any way.      ED Prescriptions     Medication Sig Dispense Auth. Provider   predniSONE (DELTASONE) 20 MG tablet Take 2 tablets (40 mg total) by mouth daily with breakfast for 5 days. 10 tablet Tomi Bamberger, PA-C      PDMP not reviewed this encounter.   Tomi Bamberger, PA-C 06/30/21 1036

## 2021-06-30 NOTE — Discharge Instructions (Addendum)
Follow up with ortho if symptoms fail to improve or worsen in any way.

## 2021-06-30 NOTE — ED Triage Notes (Signed)
Patient presents to Urgent Care with complaints of right wrist pain, swelling, and itching since yesterday morning. Treating pain with ibuprofen and mobic.   Denies injury to right hand.

## 2021-07-04 ENCOUNTER — Encounter (HOSPITAL_BASED_OUTPATIENT_CLINIC_OR_DEPARTMENT_OTHER)
Admission: RE | Admit: 2021-07-04 | Discharge: 2021-07-04 | Disposition: A | Discharge status: Home / Self Care | Payer: 59 | Source: Ambulatory Visit | Attending: Orthopaedic Surgery | Admitting: Orthopaedic Surgery

## 2021-07-04 DIAGNOSIS — Z87891 Personal history of nicotine dependence: Secondary | ICD-10-CM | POA: Diagnosis not present

## 2021-07-04 DIAGNOSIS — E119 Type 2 diabetes mellitus without complications: Secondary | ICD-10-CM | POA: Diagnosis not present

## 2021-07-04 DIAGNOSIS — X58XXXA Exposure to other specified factors, initial encounter: Secondary | ICD-10-CM | POA: Diagnosis not present

## 2021-07-04 DIAGNOSIS — Z79899 Other long term (current) drug therapy: Secondary | ICD-10-CM | POA: Diagnosis not present

## 2021-07-04 DIAGNOSIS — S83232A Complex tear of medial meniscus, current injury, left knee, initial encounter: Secondary | ICD-10-CM | POA: Diagnosis not present

## 2021-07-04 DIAGNOSIS — M179 Osteoarthritis of knee, unspecified: Secondary | ICD-10-CM | POA: Diagnosis not present

## 2021-07-04 DIAGNOSIS — Z7984 Long term (current) use of oral hypoglycemic drugs: Secondary | ICD-10-CM | POA: Diagnosis not present

## 2021-07-04 DIAGNOSIS — Z794 Long term (current) use of insulin: Secondary | ICD-10-CM | POA: Diagnosis not present

## 2021-07-04 DIAGNOSIS — I10 Essential (primary) hypertension: Secondary | ICD-10-CM | POA: Diagnosis not present

## 2021-07-04 LAB — BASIC METABOLIC PANEL
Anion gap: 4 — ABNORMAL LOW (ref 5–15)
BUN: 12 mg/dL (ref 6–20)
CO2: 28 mmol/L (ref 22–32)
Calcium: 8.9 mg/dL (ref 8.9–10.3)
Chloride: 109 mmol/L (ref 98–111)
Creatinine, Ser: 0.71 mg/dL (ref 0.44–1.00)
GFR, Estimated: >60 mL/min (ref >60)
Glucose, Bld: 116 mg/dL — ABNORMAL HIGH (ref 70–99)
Potassium: 4.3 mmol/L (ref 3.5–5.1)
Sodium: 141 mmol/L (ref 135–145)

## 2021-07-04 NOTE — Progress Notes (Signed)

## 2021-07-04 NOTE — H&P (Signed)
PREOPERATIVE H&P  Chief Complaint: LEFT KNEE MEDIAL MENISCUS TEAR  HPI: Desiree Mckinney is a 60 y.o. female who is scheduled for, Procedure(s): KNEE ARTHROSCOPY WITH MEDIAL MENISECTOMY VS. ROOT REPAIR.   Patient has a past medical history significant for GERD, HLD, HTN, DM.   Patient has had left knee pain since April 2022. She feels pain medially in the posterior aspect of her left knee.  The ache goes down into her calf.  It is a constant throbbing sensation.  She cannot bend it.  She has difficulty lifting it up. She has now tried and failed greater than six weeks of physician-directed nonoperative treatment including physical therapy and injections amongst other things.  She continues to have pain.  Her symptoms are rated as moderate to severe, and have been worsening.  This is significantly impairing activities of daily living.    Please see clinic note for further details on this patient's care.    She has elected for surgical management.   Past Medical History:  Diagnosis Date   Anxiety    Arthritis    Costochondritis    Diabetes (HCC)    GERD (gastroesophageal reflux disease)    Hyperlipidemia    Hypertension    MMT (medial meniscus tear)    left   Seasonal allergies    Sleep apnea    does not use CPAP or oral appliance   Past Surgical History:  Procedure Laterality Date   ceasarean section  1993   CHONDROPLASTY Right 07/16/2018   Procedure: CHONDROPLASTY;  Surgeon: Bjorn Pippin, MD;  Location: [facility];  Service: Orthopedics;  Laterality: Right;   KNEE ARTHROSCOPY WITH MEDIAL MENISECTOMY Right 07/16/2018   Procedure: RIGHT ARTHROSCOPY KNEE CHONDROPLASTY, MEDIALMENISCECTOMY;  Surgeon: Bjorn Pippin, MD;  Location: [facility];  Service: Orthopedics;  Laterality: Right;   SPLENECTOMY  childhood   Social History   Socioeconomic History   Marital status: Widowed    Spouse name: Not on file   Number of children: Not on file    Years of education: Not on file   Highest education level: Not on file  Occupational History   Not on file  Tobacco Use   Smoking status: Former    Types: Cigarettes    Quit date: 09/16/1984    Years since quitting: 36.8   Smokeless tobacco: Never  Substance and Sexual Activity   Alcohol use: Not Currently   Drug use: No   Sexual activity: Not on file  Other Topics Concern   Not on file  Social History Narrative   Not on file   Social Determinants of Health   Financial Resource Strain: Not on file  Food Insecurity: Not on file  Transportation Needs: Not on file  Physical Activity: Not on file  Stress: Not on file  Social Connections: Not on file   Family History  Problem Relation Age of Onset   Diabetes Father    Heart failure Father    Hypertension Father    Hyperlipidemia Father    Colon cancer Neg Hx    Allergies  Allergen Reactions   Aspirin Palpitations   Prior to Admission medications   Medication Sig Start Date End Date Taking? Authorizing Provider  atenolol (TENORMIN) 25 MG tablet Take 25 mg by mouth every evening.   Yes [provider]  clonazePAM (KLONOPIN) 0.5 MG tablet Take 0.5 mg 2 (two) times daily by mouth.   Yes [provider]  ibuprofen (ADVIL,MOTRIN)  600 MG tablet Take 800 mg by mouth every 6 (six) hours as needed for moderate pain.   Yes [provider]  lisinopril (ZESTRIL) 10 MG tablet Take 1 tablet (10 mg total) by mouth daily. 04/29/21  Yes Kathlen Mody, MD  loratadine (CLARITIN) 10 MG tablet Take 10 mg by mouth daily as needed for allergies.   Yes [provider]  meloxicam (MOBIC) 15 MG tablet Take 15 mg by mouth daily. 05/17/21  Yes [provider]  metFORMIN (GLUCOPHAGE-XR) 500 MG 24 hr tablet Take 500 mg by mouth every evening. 04/10/21  Yes [provider]  omeprazole (PRILOSEC) 20 MG capsule Take 2 capsules (40 mg total) by mouth daily. 04/28/21  Yes Kathlen Mody, MD  pravastatin  (PRAVACHOL) 20 MG tablet Take 20 mg by mouth every evening.   Yes [provider]  traMADol (ULTRAM) 50 MG tablet Take by mouth every 6 (six) hours as needed.   Yes [provider]  predniSONE (DELTASONE) 20 MG tablet Take 2 tablets (40 mg total) by mouth daily with breakfast for 5 days. 06/30/21 07/05/21  Tomi Bamberger, PA-C    ROS: All other systems have been reviewed and were otherwise negative with the exception of those mentioned in the HPI and as above.  Physical Exam: General: Alert, no acute distress Cardiovascular: No pedal edema Respiratory: No cyanosis, no use of accessory musculature GI: No organomegaly, abdomen is soft and non-tender Skin: No lesions in the area of chief complaint Neurologic: Sensation intact distally Psychiatric: Patient is competent for consent with normal mood and affect Lymphatic: No axillary or cervical lymphadenopathy  MUSCULOSKELETAL:  Left knee: Tender to palpation medial joint line.  Range of motion 0-90 degrees.  She has no pain laterally.    Imaging: MRI demonstrates a medial meniscal root tear.  Mild subchondral changes.  Some bony marrow edema as well.  Assessment: LEFT KNEE MEDIAL MENISCUS TEAR  Plan: Plan for Procedure(s): KNEE ARTHROSCOPY WITH MEDIAL MENISECTOMY VS. ROOT REPAIR  The risks benefits and alternatives were discussed with the patient including but not limited to the risks of nonoperative treatment, versus surgical intervention including infection, bleeding, nerve injury,  blood clots, cardiopulmonary complications, morbidity, mortality, among others, and they were willing to proceed.   The patient acknowledged the explanation, agreed to proceed with the plan and consent was signed.   Operative Plan: Left knee scope with  meniscal root repair versus meniscectomy Discharge Medications: Standard DVT Prophylaxis: xarelto Physical Therapy: outpatient PT Special Discharge needs: +/-   Vernetta Honey,  PA-C  07/04/2021 5:38 PM

## 2021-07-05 ENCOUNTER — Encounter (HOSPITAL_BASED_OUTPATIENT_CLINIC_OR_DEPARTMENT_OTHER)
Admission: RE | Disposition: A | Discharge status: Home / Self Care | Payer: Self-pay | Source: Ambulatory Visit | Attending: Orthopaedic Surgery

## 2021-07-05 ENCOUNTER — Ambulatory Visit (HOSPITAL_BASED_OUTPATIENT_CLINIC_OR_DEPARTMENT_OTHER)
Admission: RE | Admit: 2021-07-05 | Discharge: 2021-07-05 | Disposition: A | Discharge status: Home / Self Care | Payer: 59 | Source: Ambulatory Visit | Attending: Orthopaedic Surgery | Admitting: Orthopaedic Surgery

## 2021-07-05 ENCOUNTER — Other Ambulatory Visit: Payer: Self-pay

## 2021-07-05 ENCOUNTER — Ambulatory Visit (HOSPITAL_BASED_OUTPATIENT_CLINIC_OR_DEPARTMENT_OTHER): Payer: 59 | Admitting: Anesthesiology

## 2021-07-05 ENCOUNTER — Encounter (HOSPITAL_BASED_OUTPATIENT_CLINIC_OR_DEPARTMENT_OTHER): Payer: Self-pay | Admitting: Orthopaedic Surgery

## 2021-07-05 DIAGNOSIS — X58XXXA Exposure to other specified factors, initial encounter: Secondary | ICD-10-CM | POA: Insufficient documentation

## 2021-07-05 DIAGNOSIS — E119 Type 2 diabetes mellitus without complications: Secondary | ICD-10-CM | POA: Insufficient documentation

## 2021-07-05 DIAGNOSIS — Z794 Long term (current) use of insulin: Secondary | ICD-10-CM | POA: Insufficient documentation

## 2021-07-05 DIAGNOSIS — M179 Osteoarthritis of knee, unspecified: Secondary | ICD-10-CM | POA: Diagnosis not present

## 2021-07-05 DIAGNOSIS — S83232A Complex tear of medial meniscus, current injury, left knee, initial encounter: Secondary | ICD-10-CM | POA: Insufficient documentation

## 2021-07-05 DIAGNOSIS — I10 Essential (primary) hypertension: Secondary | ICD-10-CM | POA: Diagnosis not present

## 2021-07-05 DIAGNOSIS — Z7984 Long term (current) use of oral hypoglycemic drugs: Secondary | ICD-10-CM | POA: Insufficient documentation

## 2021-07-05 DIAGNOSIS — Z79899 Other long term (current) drug therapy: Secondary | ICD-10-CM | POA: Insufficient documentation

## 2021-07-05 DIAGNOSIS — Z87891 Personal history of nicotine dependence: Secondary | ICD-10-CM | POA: Insufficient documentation

## 2021-07-05 HISTORY — PX: KNEE ARTHROSCOPY WITH MEDIAL MENISECTOMY: SHX5651

## 2021-07-05 HISTORY — DX: Chondrocostal junction syndrome (tietze): M94.0

## 2021-07-05 LAB — GLUCOSE, CAPILLARY
Glucose-Capillary: 113 mg/dL — ABNORMAL HIGH (ref 70–99)
Glucose-Capillary: 119 mg/dL — ABNORMAL HIGH (ref 70–99)

## 2021-07-05 SURGERY — ARTHROSCOPY, KNEE, WITH MEDIAL MENISCECTOMY
Anesthesia: General | Site: Knee | Laterality: Left

## 2021-07-05 MED ORDER — OXYCODONE HCL 5 MG PO TABS: ORAL_TABLET | ORAL | 0 refills | Status: AC

## 2021-07-05 MED ORDER — PHENYLEPHRINE HCL (PRESSORS) 10 MG/ML IV SOLN
INTRAVENOUS | Status: DC | PRN
  Administered 2021-07-05: 80 ug via INTRAVENOUS

## 2021-07-05 MED ORDER — FENTANYL CITRATE (PF) 100 MCG/2ML IJ SOLN
INTRAMUSCULAR | Status: AC
  Filled 2021-07-05: qty 2

## 2021-07-05 MED ORDER — CEFAZOLIN SODIUM-DEXTROSE 2-4 GM/100ML-% IV SOLN
2.0000 g | INTRAVENOUS | Status: AC
  Administered 2021-07-05: 2 g via INTRAVENOUS

## 2021-07-05 MED ORDER — SODIUM CHLORIDE 0.9 % IR SOLN
Status: DC | PRN
  Administered 2021-07-05: 1500 mL

## 2021-07-05 MED ORDER — DEXAMETHASONE SODIUM PHOSPHATE 10 MG/ML IJ SOLN
INTRAMUSCULAR | Status: DC | PRN
Start: 2021-07-05 — End: 2021-07-05
  Administered 2021-07-05: 4 mg via INTRAVENOUS

## 2021-07-05 MED ORDER — HYDROMORPHONE HCL 1 MG/ML IJ SOLN
INTRAMUSCULAR | Status: AC
  Filled 2021-07-05: qty 0.5

## 2021-07-05 MED ORDER — ACETAMINOPHEN 500 MG PO TABS: 1000.0000 mg | ORAL_TABLET | Freq: Three times a day (TID) | ORAL | 0 refills | Status: AC

## 2021-07-05 MED ORDER — LACTATED RINGERS IV SOLN: INTRAVENOUS | Status: DC

## 2021-07-05 MED ORDER — MIDAZOLAM HCL 5 MG/5ML IJ SOLN
INTRAMUSCULAR | Status: DC | PRN
  Administered 2021-07-05: 2 mg via INTRAVENOUS

## 2021-07-05 MED ORDER — ASPIRIN 81 MG PO CHEW: 81.0000 mg | CHEWABLE_TABLET | Freq: Two times a day (BID) | ORAL | 0 refills | Status: AC

## 2021-07-05 MED ORDER — MIDAZOLAM HCL 2 MG/2ML IJ SOLN
INTRAMUSCULAR | Status: AC
  Filled 2021-07-05: qty 2

## 2021-07-05 MED ORDER — CEFAZOLIN SODIUM-DEXTROSE 2-4 GM/100ML-% IV SOLN
INTRAVENOUS | Status: AC
  Filled 2021-07-05: qty 100

## 2021-07-05 MED ORDER — ONDANSETRON HCL 4 MG/2ML IJ SOLN
INTRAMUSCULAR | Status: DC | PRN
  Administered 2021-07-05: 4 mg via INTRAVENOUS

## 2021-07-05 MED ORDER — OXYCODONE HCL 5 MG/5ML PO SOLN: 5.0000 mg | Freq: Once | ORAL | Status: AC | PRN

## 2021-07-05 MED ORDER — PROPOFOL 10 MG/ML IV BOLUS
INTRAVENOUS | Status: DC | PRN
  Administered 2021-07-05: 200 mg via INTRAVENOUS

## 2021-07-05 MED ORDER — BUPIVACAINE HCL (PF) 0.25 % IJ SOLN
INTRAMUSCULAR | Status: DC | PRN
  Administered 2021-07-05: 20 mL via INTRA_ARTICULAR

## 2021-07-05 MED ORDER — MELOXICAM 15 MG PO TABS: 15.0000 mg | ORAL_TABLET | Freq: Every day | ORAL | 0 refills | Status: DC

## 2021-07-05 MED ORDER — ONDANSETRON HCL 4 MG/2ML IJ SOLN
INTRAMUSCULAR | Status: AC
  Filled 2021-07-05: qty 2

## 2021-07-05 MED ORDER — OXYCODONE HCL 5 MG PO TABS
ORAL_TABLET | ORAL | Status: AC
  Filled 2021-07-05: qty 1

## 2021-07-05 MED ORDER — HYDROMORPHONE HCL 1 MG/ML IJ SOLN
0.2500 mg | INTRAMUSCULAR | Status: DC | PRN
  Administered 2021-07-05 (×2): 0.5 mg via INTRAVENOUS

## 2021-07-05 MED ORDER — AMISULPRIDE (ANTIEMETIC) 5 MG/2ML IV SOLN: 10.0000 mg | Freq: Once | INTRAVENOUS | Status: DC | PRN

## 2021-07-05 MED ORDER — PROPOFOL 10 MG/ML IV BOLUS
INTRAVENOUS | Status: AC
  Filled 2021-07-05: qty 20

## 2021-07-05 MED ORDER — LIDOCAINE HCL (CARDIAC) PF 100 MG/5ML IV SOSY
PREFILLED_SYRINGE | INTRAVENOUS | Status: DC | PRN
  Administered 2021-07-05: 60 mg via INTRATRACHEAL

## 2021-07-05 MED ORDER — FENTANYL CITRATE (PF) 100 MCG/2ML IJ SOLN
INTRAMUSCULAR | Status: DC | PRN
  Administered 2021-07-05 (×2): 50 ug via INTRAVENOUS

## 2021-07-05 MED ORDER — MEPERIDINE HCL 25 MG/ML IJ SOLN: 6.2500 mg | INTRAMUSCULAR | Status: DC | PRN

## 2021-07-05 MED ORDER — OXYCODONE HCL 5 MG PO TABS
5.0000 mg | ORAL_TABLET | Freq: Once | ORAL | Status: AC | PRN
  Administered 2021-07-05: 5 mg via ORAL

## 2021-07-05 SURGICAL SUPPLY — 44 items
APL PRP STRL LF DISP 70% ISPRP (MISCELLANEOUS) ×1
BANDAGE ESMARK 6X9 LF (GAUZE/BANDAGES/DRESSINGS) IMPLANT
BLADE CLIPPER SURG (BLADE) IMPLANT
BLADE SHAVER BONE 5.0X13 (MISCELLANEOUS) IMPLANT
BNDG CMPR 9X6 STRL LF SNTH (GAUZE/BANDAGES/DRESSINGS)
BNDG ELASTIC 6X5.8 VLCR STR LF (GAUZE/BANDAGES/DRESSINGS) ×2 IMPLANT
BNDG ESMARK 6X9 LF (GAUZE/BANDAGES/DRESSINGS)
BURR OVAL 8 FLU 4.0X13 (MISCELLANEOUS) IMPLANT
CHLORAPREP W/TINT 26 (MISCELLANEOUS) ×2 IMPLANT
CLSR STERI-STRIP ANTIMIC 1/2X4 (GAUZE/BANDAGES/DRESSINGS) ×2 IMPLANT
CUFF TOURN SGL QUICK 34 (TOURNIQUET CUFF)
CUFF TRNQT CYL 34X4.125X (TOURNIQUET CUFF) IMPLANT
CUTTER TENSIONER SUT 2-0 0 FBW (INSTRUMENTS) IMPLANT
DISSECTOR 3.5MM X 13CM CVD (MISCELLANEOUS) IMPLANT
DISSECTOR 4.0MMX13CM CVD (MISCELLANEOUS) ×2 IMPLANT
DRAPE ARTHROSCOPY W/POUCH 90 (DRAPES) ×2 IMPLANT
DRAPE IMP U-DRAPE 54X76 (DRAPES) ×2 IMPLANT
DRAPE U-SHAPE 47X51 STRL (DRAPES) ×2 IMPLANT
ELECT REM PT RETURN 9FT ADLT (ELECTROSURGICAL) ×2
ELECTRODE REM PT RTRN 9FT ADLT (ELECTROSURGICAL) ×1 IMPLANT
FIBERSTICK 2 (SUTURE) IMPLANT
GAUZE SPONGE 4X4 12PLY STRL (GAUZE/BANDAGES/DRESSINGS) IMPLANT
GLOVE SRG 8 PF TXTR STRL LF DI (GLOVE) ×1 IMPLANT
GLOVE SURG ENC MOIS LTX SZ6.5 (GLOVE) ×4 IMPLANT
GLOVE SURG LTX SZ8 (GLOVE) ×4 IMPLANT
GLOVE SURG UNDER POLY LF SZ6.5 (GLOVE) ×2 IMPLANT
GLOVE SURG UNDER POLY LF SZ7 (GLOVE) ×4 IMPLANT
GLOVE SURG UNDER POLY LF SZ8 (GLOVE) ×2
GOWN STRL REUS W/ TWL LRG LVL3 (GOWN DISPOSABLE) ×2 IMPLANT
GOWN STRL REUS W/TWL LRG LVL3 (GOWN DISPOSABLE) ×4
GOWN STRL REUS W/TWL XL LVL3 (GOWN DISPOSABLE) ×2 IMPLANT
IV NS IRRIG 3000ML ARTHROMATIC (IV SOLUTION) ×2 IMPLANT
KIT TURNOVER KIT B (KITS) ×2 IMPLANT
MANIFOLD NEPTUNE II (INSTRUMENTS) IMPLANT
NS IRRIG 1000ML POUR BTL (IV SOLUTION) IMPLANT
PACK ARTHROSCOPY DSU (CUSTOM PROCEDURE TRAY) ×2 IMPLANT
PADDING CAST COTTON 6X4 STRL (CAST SUPPLIES) IMPLANT
PORT APPOLLO RF 90DEGREE MULTI (SURGICAL WAND) IMPLANT
SLEEVE SCD COMPRESS KNEE MED (STOCKING) ×2 IMPLANT
SPONGE T-LAP 4X18 ~~LOC~~+RFID (SPONGE) ×2 IMPLANT
SUT MNCRL AB 4-0 PS2 18 (SUTURE) ×2 IMPLANT
TOWEL GREEN STERILE FF (TOWEL DISPOSABLE) ×2 IMPLANT
TUBE CONNECTING 20X1/4 (TUBING) ×2 IMPLANT
TUBING ARTHROSCOPY IRRIG 16FT (MISCELLANEOUS) ×2 IMPLANT

## 2021-07-05 NOTE — Discharge Instructions (Addendum)
Ramond Marrow MD, MPH Alfonse Alpers, PA-C [facility] [address] [phone] (tel)   [phone] (fax)   POST-OPERATIVE INSTRUCTIONS - Knee Arthroscopy  WOUND CARE - You may remove the Operative Dressing on Post-Op Day #3 (72hrs after surgery).   -  Alternatively if you would like you can leave dressing on until follow-up if within 7-8 days but keep it dry. - Leave steri-strips in place until they fall off on their own, usually 2 weeks postop. - An ACE wrap may be used to control swelling, do not wrap this too tight.  If the initial ACE wrap feels too tight you may loosen it. - There may be a small amount of fluid/bleeding leaking at the surgical site.  - This is normal; the knee is filled with fluid during the procedure and can leak for 24-48hrs after surgery. You may change/reinforce the bandage as needed.  - Use the Cryocuff or Ice as often as possible for the first 7 days, then as needed for pain relief. Always keep a towel, ACE wrap or other barrier between the cooling unit and your skin.  - You may shower on Post-Op Day #3. Gently pat the area dry. Do not soak the knee in water or submerge it.  - Do not go swimming in the pool or ocean until 4 weeks after surgery or when otherwise instructed.  Keep incisions as dry as possible.   BRACE/AMBULATION -  You will not need a brace after this procedure.   - You may use crutches or a walker initially to help you ambulate, but this is not required - You can put full weight on your operative leg as you feel comfortable  Please continue to ambulate and do not stay sitting or lying for too long.  Perform foot and wrist pumps to assist in circulation. To help improve circulation and blood flow after surgery, mobilize every 2-3 hours   REGIONAL ANESTHESIA (NERVE BLOCKS) - The anesthesia team may have performed a nerve block for you if safe in the setting of your care.  This is a great tool used to minimize  pain.  Typically the block may start wearing off overnight.  This can be a challenging period but please utilize your as needed pain medications to try and manage this period and know it will be a brief transition as the nerve block wears completely   POST-OP MEDICATIONS - Multimodal approach to pain control - In general your pain will be controlled with a combination of substances.  Prescriptions unless otherwise discussed are electronically sent to your pharmacy.  This is a carefully made plan we use to minimize narcotic use.     - Meloxicam - Anti-inflammatory medication taken on a scheduled basis - Acetaminophen - Non-narcotic pain medicine taken on a scheduled basis  - Oxycodone - This is a strong narcotic, to be used only on an "as needed" basis for SEVERE pain. - Aspirin 81 mg - This medicine is used to minimize the risk of blood clots after surgery.    FOLLOW-UP   Please call the office to schedule a follow-up appointment for your incision check, 7-10 days post-operatively.   IF YOU HAVE ANY QUESTIONS, PLEASE FEEL FREE TO CALL OUR OFFICE.   HELPFUL INFORMATION  - If you had a block, it will wear off between 8-24 hrs postop typically.  This is period when your pain may go from nearly zero to the pain you would have had post-op  without the block.  This is an abrupt transition but nothing dangerous is happening.  You may take an extra dose of narcotic when this happens.   Keep your leg elevated to decrease swelling, which will then in turn decrease your pain. I would elevate the foot of your bed by putting a couple of couch pillows between your mattress and box spring. I would not keep pillow directly under your ankle.  - Do not sleep with a pillow behind your knee even if it is more comfortable as this may make it harder to get your knee fully straight long term.   There will be MORE swelling on days 1-3 than there is on the day of surgery.  This also is normal. The swelling will  decrease with the anti-inflammatory medication, ice and keeping it elevated. The swelling will make it more difficult to bend your knee. As the swelling goes down your motion will become easier   You may develop swelling and bruising that extends from your knee down to your calf and perhaps even to your foot over the next week. Do not be alarmed. This too is normal, and it is due to gravity   There may be some numbness adjacent to the incision site. This may last for 6-12 months or longer in some patients and is expected.   You may return to sedentary work/school in the next couple of days when you feel up to it. You will need to keep your leg elevated as much as possible    You should wean off your narcotic medicines as soon as you are able.  Most patients will be off or using minimal narcotics before their first postop appointment.    We suggest you use the pain medication the first night prior to going to bed, in order to ease any pain when the anesthesia wears off. You should avoid taking pain medications on an empty stomach as it will make you nauseous.   Do not drink alcoholic beverages or take illicit drugs when taking pain medications.   It is against the law to drive while taking narcotics. You cannot drive if your Right leg is in brace locked in extension.   Pain medication may make you constipated.  Below are a few solutions to try in this order:  o Decrease the amount of pain medication if you aren't having pain.  o Drink lots of decaffeinated fluids.  o Drink prune juice and/or eat dried prunes   o If the first 3 don't work start with additional solutions  o Take Colace - an over-the-counter stool softener  o Take Senokot - an over-the-counter laxative  o Take Miralax - a stronger over-the-counter laxative    For more information including helpful videos and documents visit our website:   [email or web address]        Post  Anesthesia Home Care Instructions  Activity: Get plenty of rest for the remainder of the day. A responsible individual must stay with you for 24 hours following the procedure.  For the next 24 hours, DO NOT: -Drive a car -Advertising copywriter -Drink alcoholic beverages -Take any medication unless instructed by your physician -Make any legal decisions or sign important papers.  Meals: Start with liquid foods such as gelatin or soup. Progress to regular foods as tolerated. Avoid greasy, spicy, heavy foods. If nausea and/or vomiting occur, drink only clear liquids until the nausea and/or vomiting subsides. Call your physician if vomiting continues.  Special Instructions/Symptoms: Your throat may  feel dry or sore from the anesthesia or the breathing tube placed in your throat during surgery. If this causes discomfort, gargle with warm salt water. The discomfort should disappear within 24 hours.  If you had a scopolamine patch placed behind your ear for the management of post- operative nausea and/or vomiting:  1. The medication in the patch is effective for 72 hours, after which it should be removed.  Wrap patch in a tissue and discard in the trash. Wash hands thoroughly with soap and water. 2. You may remove the patch earlier than 72 hours if you experience unpleasant side effects which may include dry mouth, dizziness or visual disturbances. 3. Avoid touching the patch. Wash your hands with soap and water after contact with the patch.

## 2021-07-05 NOTE — Anesthesia Postprocedure Evaluation (Signed)
Anesthesia Post Note  Patient: Desiree Mckinney  Procedure(s) Performed: KNEE ARTHROSCOPY WITH MEDIAL MENISECTOMY (Left: Knee)     Patient location during evaluation: PACU Anesthesia Type: General Level of consciousness: awake and alert Pain management: pain level controlled Vital Signs Assessment: post-procedure vital signs reviewed and stable Respiratory status: spontaneous breathing, nonlabored ventilation and respiratory function stable Cardiovascular status: blood pressure returned to baseline and stable Postop Assessment: no apparent nausea or vomiting Anesthetic complications: no   No notable events documented.  Last Vitals:  Vitals:   07/05/21 1015 07/05/21 1049  BP: (!) 146/76 (!) 152/89  Pulse: 74 65  Resp: 15 14  Temp:  36.4 C  SpO2: 96% 94%    Last Pain:  Vitals:   07/05/21 1049  TempSrc:   PainSc: 7                  Lowella Curb

## 2021-07-05 NOTE — Transfer of Care (Signed)
Immediate Anesthesia Transfer of Care Note  Patient: Desiree Mckinney  Procedure(s) Performed: KNEE ARTHROSCOPY WITH MEDIAL MENISECTOMY (Left: Knee)  Patient Location: PACU  Anesthesia Type:General  Level of Consciousness: drowsy  Airway & Oxygen Therapy: Patient Spontanous Breathing and Patient connected to face mask oxygen  Post-op Assessment: Report given to RN and Post -op Vital signs reviewed and stable  Post vital signs: Reviewed and stable  Last Vitals:  Vitals Value Taken Time  BP 146/83 07/05/21 0958  Temp    Pulse 80 07/05/21 0958  Resp 11 07/05/21 0958  SpO2 100 % 07/05/21 0958    Last Pain:  Vitals:   07/05/21 0825  TempSrc: Oral  PainSc: 5          Complications: No notable events documented.

## 2021-07-05 NOTE — Op Note (Signed)
Orthopaedic Surgery Operative Note (CSN: [id number])  Desiree Mckinney  09-18-1960 Date of Surgery: 07/05/2021   Diagnoses:  LEFT KNEE MEDIAL MENISCUS TEAR  Procedure: Left medial partial meniscectomy and chondroplasty   Operative Finding Exam under anesthesia: Full motion no limitation Suprapatellar pouch: Normal Patellofemoral Compartment: Normal Medial Compartment: Far medial grade 2 changes on a 2 x 3 cm area of the medial femoral condyle that engages with the patient was in full extension.  She had some posterior medial tibial plateau wear grade 2 and 3 as well.  Overall the joint looked reasonable however she had a complex meniscus tear involving the mid body as well as the root.  This was not amenable to repair and we performed a meniscectomy resecting about 20% total meniscal volume. Lateral Compartment: Normal Intercondylar Notch: Normal  Successful completion of the planned procedure.  Uncomplicated procedure.  The patient's arthritis was mild to moderate but her meniscal tear was complex and was not appropriate for repair.  Post-operative plan: The patient will be weightbearing to tolerance.  The patient will be discharged home with early therapy.  DVT prophylaxis Aspirin 81 mg twice daily for 6 weeks.  Though she has an aspirin allergy she tolerated this well last surgery.  Pain control with PRN pain medication preferring oral medicines.  Follow up plan will be scheduled in approximately 7 days for incision check.  Post-Op Diagnosis: Same Surgeons:Primary: Bjorn Pippin, MD Assistants:Caroline McBane PA-C Location: [facility] OR ROOM 6 Anesthesia: General with local Antibiotics: Ancef 2 g Tourniquet time:  Total Tourniquet Time Documented: Thigh (Left) - 10 minutes Total: Thigh (Left) - 10 minutes  Estimated Blood Loss: Minimal Complications: None Specimens: None Implants: * No implants in log *  Indications for Surgery:   Desiree Mckinney is a 60 y.o. female with  continued mechanical symptoms and a medial meniscus root tear.  Benefits and risks of operative and nonoperative management were discussed prior to surgery with patient/guardian(s) and informed consent form was completed.  Specific risks including infection, need for additional surgery, continued pain, post meniscectomy syndrome, need for arthroplasty amongst others.   Procedure:   The patient was identified properly. Informed consent was obtained and the surgical site was marked. The patient was taken up to suite where general anesthesia was induced. The patient was placed in the supine position with a post against the surgical leg and a nonsterile tourniquet applied. The surgical leg was then prepped and draped usual sterile fashion.  A standard surgical timeout was performed.  2 standard anterior portals were made and diagnostic arthroscopy performed. Please note the findings as noted above.  We identified the medial meniscus tear and was not amenable to repair.  We debrided it with a basket and shaver back to a stable base.  No parrot-beak or loose bodies were noted.  We performed a gentle chondroplasty of the medial femoral condyle and medial tibial plateau.  Incisions closed with absorbable suture. The patient was awoken from general anesthesia and taken to the PACU in stable condition without complication.   Alfonse Alpers, PA-C, present and scrubbed throughout the case, critical for completion in a timely fashion, and for retraction, instrumentation, closure.

## 2021-07-05 NOTE — Anesthesia Procedure Notes (Signed)
Procedure Name: LMA Insertion Date/Time: 07/05/2021 9:22 AM Performed by: Thornell Mule, CRNA Pre-anesthesia Checklist: Patient identified, Emergency Drugs available, Suction available and Patient being monitored Patient Re-evaluated:Patient Re-evaluated prior to induction Oxygen Delivery Method: Circle system utilized Preoxygenation: Pre-oxygenation with 100% oxygen Induction Type: IV induction LMA: LMA inserted LMA Size: 4.0 Number of attempts: 1 Placement Confirmation: positive ETCO2 Tube secured with: Tape Dental Injury: Teeth and Oropharynx as per pre-operative assessment

## 2021-07-05 NOTE — Anesthesia Preprocedure Evaluation (Addendum)
Anesthesia Evaluation  Patient identified by MRN, date of birth, ID band Patient awake    Reviewed: Allergy & Precautions, NPO status , Patient's Chart, lab work & pertinent test results, reviewed documented beta blocker date and time   Airway Mallampati: II  TM Distance: >3 FB Neck ROM: Full    Dental no notable dental hx. (+) Teeth Intact   Pulmonary sleep apnea , former smoker,    Pulmonary exam normal breath sounds clear to auscultation       Cardiovascular hypertension, Pt. on medications and Pt. on home beta blockers Normal cardiovascular exam Rhythm:Regular Rate:Normal     Neuro/Psych Anxiety negative neurological ROS     GI/Hepatic Neg liver ROS, GERD  Medicated and Controlled,  Endo/Other  diabetes, Well Controlled, Type 2Obesity Hyperlipidemia  Renal/GU negative Renal ROS  negative genitourinary   Musculoskeletal  (+) Arthritis , Osteoarthritis,  Torn Medial Meniscus left knee   Abdominal (+) + obese,   Peds  Hematology negative hematology ROS (+)   Anesthesia Other Findings   Reproductive/Obstetrics                             Anesthesia Physical  Anesthesia Plan  ASA: 3  Anesthesia Plan: General   Post-op Pain Management:    Induction: Intravenous  PONV Risk Score and Plan: 3 and Ondansetron, Dexamethasone, Midazolam and Treatment may vary due to age or medical condition  Airway Management Planned: LMA  Additional Equipment:   Intra-op Plan:   Post-operative Plan: Extubation in OR  Informed Consent: I have reviewed the patients History and Physical, chart, labs and discussed the procedure including the risks, benefits and alternatives for the proposed anesthesia with the patient or authorized representative who has indicated his/her understanding and acceptance.     Dental advisory given  Plan Discussed with: CRNA and Surgeon  Anesthesia Plan Comments:          Anesthesia Quick Evaluation

## 2021-07-06 ENCOUNTER — Encounter (HOSPITAL_BASED_OUTPATIENT_CLINIC_OR_DEPARTMENT_OTHER): Payer: Self-pay | Admitting: Orthopaedic Surgery

## 2021-07-07 ENCOUNTER — Telehealth: Payer: 59 | Admitting: Nurse Practitioner

## 2021-07-07 DIAGNOSIS — R399 Unspecified symptoms and signs involving the genitourinary system: Secondary | ICD-10-CM | POA: Diagnosis not present

## 2021-07-07 DIAGNOSIS — M545 Low back pain, unspecified: Secondary | ICD-10-CM | POA: Diagnosis not present

## 2021-07-07 MED ORDER — CEPHALEXIN 500 MG PO CAPS: 500.0000 mg | ORAL_CAPSULE | Freq: Two times a day (BID) | ORAL | 0 refills | Status: DC

## 2021-07-07 NOTE — Progress Notes (Signed)
Based on what you shared with me it looks like you have uti symptoms with back pain,that should be evaluated in a face to face office visit. Due to the associating back pain you will need a urinalysis and urine culture for proper treatment. NOTE: There will be NO CHARGE for this eVisit   If you are having a true medical emergency please call 911.      For an urgent face to face visit, [place] has six urgent care centers for your convenience:     [facility] [place] Get Driving Directions [phone] [address] [place], [place] [address]    [facility] ([place]) Get Driving Directions [phone] [address] [place], [place] [address]  [facility] ([place]) Get Driving Directions [phone] [address] [place],  [place]  [address]  [facility] Get Driving Directions [phone] [address] [place], [place] [address]    [facility] [place] Get Driving Directions  [phone] [address] [place], [place] [address]    [facility] Get Driving Directions [phone] [address] [place], [place] [address]  Your MyChart E-visit questionnaire answers were reviewed by a board certified advanced clinical practitioner to complete your personal care plan based on your specific symptoms.  Thank you for using e-Visits.         

## 2021-07-07 NOTE — Addendum Note (Signed)
Addended by: Bennie Pierini on: 07/07/2021 07:41 AM   Modules accepted: Orders

## 2021-10-01 ENCOUNTER — Telehealth: Payer: Self-pay | Admitting: Nurse Practitioner

## 2021-10-01 DIAGNOSIS — N3000 Acute cystitis without hematuria: Secondary | ICD-10-CM

## 2021-10-01 MED ORDER — CEPHALEXIN 500 MG PO CAPS: 500.0000 mg | ORAL_CAPSULE | Freq: Two times a day (BID) | ORAL | 0 refills | Status: AC

## 2021-10-01 NOTE — Progress Notes (Signed)
E-Visit for Urinary Problems  We are sorry that you are not feeling well.  Here is how we plan to help!  Based on what you shared with me it looks like you most likely have a simple urinary tract infection.  A UTI (Urinary Tract Infection) is a bacterial infection of the bladder.  Most cases of urinary tract infections are simple to treat but a key part of your care is to encourage you to drink plenty of fluids and watch your symptoms carefully.  I have prescribed Keflex 500 mg twice a day for 7 days.  Your symptoms should gradually improve. Call us if the burning in your urine worsens, you develop worsening fever, back pain or pelvic pain or if your symptoms do not resolve after completing the antibiotic.  Urinary tract infections can be prevented by drinking plenty of water to keep your body hydrated.  Also be sure when you wipe, wipe from front to back and don't hold it in!  If possible, empty your bladder every 4 hours.  HOME CARE Drink plenty of fluids Compete the full course of the antibiotics even if the symptoms resolve Remember, when you need to go.go. Holding in your urine can increase the likelihood of getting a UTI! GET HELP RIGHT AWAY IF: You cannot urinate You get a high fever Worsening back pain occurs You see blood in your urine You feel sick to your stomach or throw up You feel like you are going to pass out  MAKE SURE YOU  Understand these instructions. Will watch your condition. Will get help right away if you are not doing well or get worse.   Thank you for choosing an e-visit.  Your e-visit answers were reviewed by a board certified advanced clinical practitioner to complete your personal care plan. Depending upon the condition, your plan could have included both over the counter or prescription medications.  Please review your pharmacy choice. Make sure the pharmacy is open so you can pick up prescription now. If there is a problem, you may contact your  provider through MyChart messaging and have the prescription routed to another pharmacy.  Your safety is important to us. If you have drug allergies check your prescription carefully.   For the next 24 hours you can use MyChart to ask questions about today's visit, request a non-urgent call back, or ask for a work or school excuse. You will get an email in the next two days asking about your experience. I hope that your e-visit has been valuable and will speed your recovery.   I spent approximately 5 minutes reviewing the patient's history, current symptoms and coordinating their plan of care today  Meds ordered this encounter  Medications   cephALEXin (KEFLEX) 500 MG capsule    Sig: Take 1 capsule (500 mg total) by mouth 2 (two) times daily for 7 days.    Dispense:  14 capsule    Refill:  0    

## 2022-03-16 ENCOUNTER — Telehealth: Payer: Self-pay | Admitting: Emergency Medicine

## 2022-03-16 DIAGNOSIS — N3000 Acute cystitis without hematuria: Secondary | ICD-10-CM

## 2022-03-16 MED ORDER — CEPHALEXIN 500 MG PO CAPS: 500.0000 mg | ORAL_CAPSULE | Freq: Two times a day (BID) | ORAL | 0 refills | Status: AC

## 2022-03-16 NOTE — Progress Notes (Signed)
E-Visit for Urinary Problems ? ?We are sorry that you are not feeling well.  Here is how we plan to help! ? ?Based on what you shared with me it looks like you most likely have a simple urinary tract infection. ? ?A UTI (Urinary Tract Infection) is a bacterial infection of the bladder. ? ?Most cases of urinary tract infections are simple to treat but a key part of your care is to encourage you to drink plenty of fluids and watch your symptoms carefully. ? ?I have prescribed Keflex 500 mg twice a day for 7 days.  Your symptoms should gradually improve. Call us if the burning in your urine worsens, you develop worsening fever, back pain or pelvic pain or if your symptoms do not resolve after completing the antibiotic. ? ?Urinary tract infections can be prevented by drinking plenty of water to keep your body hydrated.  Also be sure when you wipe, wipe from front to back and don't hold it in!  If possible, empty your bladder every 4 hours. ? ?HOME CARE ?Drink plenty of fluids ?Compete the full course of the antibiotics even if the symptoms resolve ?Remember, when you need to go?go. Holding in your urine can increase the likelihood of getting a UTI! ?GET HELP RIGHT AWAY IF: ?You cannot urinate ?You get a high fever ?Worsening back pain occurs ?You see blood in your urine ?You feel sick to your stomach or throw up ?You feel like you are going to pass out ? ?MAKE SURE YOU  ?Understand these instructions. ?Will watch your condition. ?Will get help right away if you are not doing well or get worse. ? ? ?Thank you for choosing an e-visit. ? ?Your e-visit answers were reviewed by a board certified advanced clinical practitioner to complete your personal care plan. Depending upon the condition, your plan could have included both over the counter or prescription medications. ? ?Please review your pharmacy choice. Make sure the pharmacy is open so you can pick up prescription now. If there is a problem, you may contact your  provider through MyChart messaging and have the prescription routed to another pharmacy.  Your safety is important to us. If you have drug allergies check your prescription carefully.  ? ?For the next 24 hours you can use MyChart to ask questions about today's visit, request a non-urgent call back, or ask for a work or school excuse. ?You will get an email in the next two days asking about your experience. I hope that your e-visit has been valuable and will speed your recovery. ? ?I have spent 5 minutes in review of e-visit questionnaire, review and updating patient chart, medical decision making and response to patient.  ? ?Ashante Yellin, PhD, FNP-BC ?  ?

## 2022-04-10 ENCOUNTER — Emergency Department (HOSPITAL_COMMUNITY)
Admission: EM | Admit: 2022-04-10 | Discharge: 2022-04-10 | Disposition: A | Discharge status: Home / Self Care | Payer: 59 | Attending: Emergency Medicine | Admitting: Emergency Medicine

## 2022-04-10 ENCOUNTER — Emergency Department (HOSPITAL_COMMUNITY): Payer: 59

## 2022-04-10 ENCOUNTER — Other Ambulatory Visit: Payer: Self-pay

## 2022-04-10 ENCOUNTER — Encounter (HOSPITAL_COMMUNITY): Payer: Self-pay

## 2022-04-10 DIAGNOSIS — I10 Essential (primary) hypertension: Secondary | ICD-10-CM | POA: Diagnosis not present

## 2022-04-10 DIAGNOSIS — Z79899 Other long term (current) drug therapy: Secondary | ICD-10-CM | POA: Insufficient documentation

## 2022-04-10 DIAGNOSIS — R0789 Other chest pain: Secondary | ICD-10-CM | POA: Insufficient documentation

## 2022-04-10 DIAGNOSIS — D72829 Elevated white blood cell count, unspecified: Secondary | ICD-10-CM | POA: Insufficient documentation

## 2022-04-10 DIAGNOSIS — E119 Type 2 diabetes mellitus without complications: Secondary | ICD-10-CM | POA: Insufficient documentation

## 2022-04-10 DIAGNOSIS — R0602 Shortness of breath: Secondary | ICD-10-CM | POA: Insufficient documentation

## 2022-04-10 DIAGNOSIS — Z7984 Long term (current) use of oral hypoglycemic drugs: Secondary | ICD-10-CM | POA: Insufficient documentation

## 2022-04-10 LAB — COMPREHENSIVE METABOLIC PANEL
ALT: 11 U/L (ref 0–44)
AST: 15 U/L (ref 15–41)
Albumin: 3.9 g/dL (ref 3.5–5.0)
Alkaline Phosphatase: 76 U/L (ref 38–126)
Anion gap: 8 (ref 5–15)
BUN: 12 mg/dL (ref 6–20)
CO2: 27 mmol/L (ref 22–32)
Calcium: 9.5 mg/dL (ref 8.9–10.3)
Chloride: 111 mmol/L (ref 98–111)
Creatinine, Ser: 0.61 mg/dL (ref 0.44–1.00)
GFR, Estimated: >60 mL/min (ref >60)
Glucose, Bld: 138 mg/dL — ABNORMAL HIGH (ref 70–99)
Potassium: 3.5 mmol/L (ref 3.5–5.1)
Sodium: 146 mmol/L — ABNORMAL HIGH (ref 135–145)
Total Bilirubin: 0.6 mg/dL (ref 0.3–1.2)
Total Protein: 7.9 g/dL (ref 6.5–8.1)

## 2022-04-10 LAB — CBC WITH DIFFERENTIAL/PLATELET
Abs Immature Granulocytes: 0.03 10*3/uL (ref 0.00–0.07)
Basophils Absolute: 0.1 10*3/uL (ref 0.0–0.1)
Basophils Relative: 1 %
Eosinophils Absolute: 0 10*3/uL (ref 0.0–0.5)
Eosinophils Relative: 0 %
HCT: 35.8 % — ABNORMAL LOW (ref 36.0–46.0)
Hemoglobin: 11.8 g/dL — ABNORMAL LOW (ref 12.0–15.0)
Immature Granulocytes: 0 %
Lymphocytes Relative: 31 %
Lymphs Abs: 3.4 10*3/uL (ref 0.7–4.0)
MCH: 30.6 pg (ref 26.0–34.0)
MCHC: 33 g/dL (ref 30.0–36.0)
MCV: 92.7 fL (ref 80.0–100.0)
Monocytes Absolute: 0.6 10*3/uL (ref 0.1–1.0)
Monocytes Relative: 5 %
Neutro Abs: 6.8 10*3/uL (ref 1.7–7.7)
Neutrophils Relative %: 63 %
Platelets: 280 10*3/uL (ref 150–400)
RBC: 3.86 MIL/uL — ABNORMAL LOW (ref 3.87–5.11)
RDW: 14.6 % (ref 11.5–15.5)
WBC: 10.9 10*3/uL — ABNORMAL HIGH (ref 4.0–10.5)
nRBC: 0 % (ref 0.0–0.2)

## 2022-04-10 LAB — TROPONIN I (HIGH SENSITIVITY): Troponin I (High Sensitivity): 5 ng/L (ref <18)

## 2022-04-10 LAB — D-DIMER, QUANTITATIVE: D-Dimer, Quant: 0.32 ug/mL-FEU (ref 0.00–0.50)

## 2022-04-10 LAB — BRAIN NATRIURETIC PEPTIDE: B Natriuretic Peptide: 85 pg/mL (ref 0.0–100.0)

## 2022-04-10 MED ORDER — HYDROCODONE-ACETAMINOPHEN 5-325 MG PO TABS
1.0000 | ORAL_TABLET | Freq: Once | ORAL | Status: AC
  Administered 2022-04-10: 1 via ORAL
  Filled 2022-04-10: qty 1

## 2022-04-10 MED ORDER — KETOROLAC TROMETHAMINE 15 MG/ML IJ SOLN
15.0000 mg | Freq: Once | INTRAMUSCULAR | Status: AC
  Administered 2022-04-10: 15 mg via INTRAVENOUS
  Filled 2022-04-10: qty 1

## 2022-04-10 MED ORDER — CLONAZEPAM 0.5 MG PO TABS
0.5000 mg | ORAL_TABLET | Freq: Once | ORAL | Status: AC
  Administered 2022-04-10: 0.5 mg via ORAL
  Filled 2022-04-10: qty 1

## 2022-04-10 NOTE — ED Notes (Signed)
Report received from previous RN. Pt continues to c/o intermittent SOB present at rest, worsening when laying back, improving with standing. Denies SOB at this time. SOB associated with chronic mid-sternal chest pain that current does not feel different. Endorses increased stress. Denies swelling/recent weight gain. VS WDL.

## 2022-04-10 NOTE — ED Provider Notes (Signed)
[facility]-EMERGENCY DEPT Provider Note   CSN: [id number] Arrival date & time: 04/10/22  0042     History  Chief Complaint  Patient presents with   Shortness of Breath    Desiree Mckinney is a 62 y.o. female.  The history is provided by the patient and medical records.  Shortness of Breath Desiree Mckinney is a 61 y.o. female who presents to the Emergency Department complaining of shortness of breath.  She presents to the emergency department for evaluation of shortness of breath that is present when she lays down and when she sits up that started at 8 PM.  Her shortness of breath resolves when she ambulates and exerts herself.  She also reports associated central chest pain that is described as her ongoing costochondritis.  She has associated cough that is nonproductive.  No pain on breathing.  No leg edema.  No abdominal pain, nausea, vomiting.    No fever.  Has a hx/o HTN, DM, anxiety.    No tobacco, alcohol, drug use.     Home Medications Prior to Admission medications   Medication Sig Start Date End Date Taking? Authorizing Provider  atenolol (TENORMIN) 25 MG tablet Take 25 mg by mouth every evening.   Yes [provider]  clonazePAM (KLONOPIN) 0.5 MG tablet Take 0.5 mg by mouth 2 (two) times daily as needed for anxiety.   Yes [provider]  fexofenadine (ALLEGRA) 60 MG tablet Take 60 mg by mouth daily.   Yes [provider]  lisinopril (ZESTRIL) 10 MG tablet Take 1 tablet (10 mg total) by mouth daily. 04/29/21  Yes Kathlen Mody, MD  meloxicam (MOBIC) 15 MG tablet Take 1 tablet (15 mg total) by mouth daily. For pain and inflammation Patient taking differently: Take 15 mg by mouth daily as needed (for pain and inflammation). For pain and inflammation 07/05/21  Yes McBane, Jerald Kief, PA-C  metFORMIN (GLUCOPHAGE-XR) 500 MG 24 hr tablet Take 500 mg by mouth every evening. 04/10/21  Yes [provider]  omeprazole  (PRILOSEC) 20 MG capsule Take 2 capsules (40 mg total) by mouth daily. Patient taking differently: Take 20 mg by mouth daily. 04/28/21  Yes Kathlen Mody, MD  pravastatin (PRAVACHOL) 20 MG tablet Take 20 mg by mouth every evening.   Yes [provider]  traMADol (ULTRAM) 50 MG tablet Take 50 mg by mouth 2 (two) times daily as needed for moderate pain. 03/21/22  Yes [provider]      Allergies    Buspar [buspirone], Effexor [venlafaxine], Metformin and related, and Aspirin    Review of Systems   Review of Systems  Respiratory:  Positive for shortness of breath.   All other systems reviewed and are negative.   Physical Exam Updated Vital Signs BP 133/68   Pulse 71   Temp 98.4 F (36.9 C) (Oral)   Resp (!) 22   Ht 5\' 5"  (1.651 m)   Wt 95.3 kg   LMP 05/17/2012 (Approximate)   SpO2 98%   BMI 34.95 kg/m  Physical Exam Vitals and nursing note reviewed.  Constitutional:      Appearance: She is well-developed.  HENT:     Head: Normocephalic and atraumatic.  Cardiovascular:     Rate and Rhythm: Normal rate and regular rhythm.     Heart sounds: No murmur heard. Pulmonary:     Effort: Pulmonary effort is normal. No respiratory distress.     Breath sounds: Normal breath sounds.  Chest:  Chest wall: Tenderness present.  Abdominal:     Palpations: Abdomen is soft.     Tenderness: There is no abdominal tenderness. There is no guarding or rebound.  Musculoskeletal:        General: No swelling or tenderness.  Skin:    General: Skin is warm and dry.  Neurological:     Mental Status: She is alert and oriented to person, place, and time.  Psychiatric:        Behavior: Behavior normal.     ED Results / Procedures / Treatments   Labs (all labs ordered are listed, but only abnormal results are displayed) Labs Reviewed  COMPREHENSIVE METABOLIC PANEL - Abnormal; Notable for the following components:      Result Value   Sodium 146 (*)    Glucose, Bld 138 (*)     All other components within normal limits  CBC WITH DIFFERENTIAL/PLATELET - Abnormal; Notable for the following components:   WBC 10.9 (*)    RBC 3.86 (*)    Hemoglobin 11.8 (*)    HCT 35.8 (*)    All other components within normal limits  BRAIN NATRIURETIC PEPTIDE  D-DIMER, QUANTITATIVE  TROPONIN I (HIGH SENSITIVITY)  TROPONIN I (HIGH SENSITIVITY)    EKG EKG Interpretation  Date/Time:  Wednesday April 10 2022 00:49:41 EDT Ventricular Rate:  83 PR Interval:  162 QRS Duration: 149 QT Interval:  419 QTC Calculation: 493 R Axis:   67 Text Interpretation: Sinus or ectopic atrial rhythm Left bundle branch block Confirmed by Tilden Fossa [phone]) on 04/10/2022 3:41:33 AM  Radiology DG Chest 2 View  Result Date: 04/10/2022 CLINICAL DATA:  Chest pain and shortness breath. EXAM: CHEST - 2 VIEW COMPARISON:  CTA chest 04/27/2021. FINDINGS: The heart is slightly enlarged. No vascular congestion or edema is seen. The mediastinum is normally outlined. Mild transverse segment aortic atherosclerosis. The lungs are clear. No pleural effusion is seen. There is a tangle of overlying monitor wires anteriorly. Thoracic cage is intact. IMPRESSION: No active cardiopulmonary disease. Stable chest with slight cardiomegaly. Aortic atherosclerosis. Electronically Signed   By: Almira Bar M.D.   On: 04/10/2022 04:40    Procedures Procedures    Medications Ordered in ED Medications  HYDROcodone-acetaminophen (NORCO/VICODIN) 5-325 MG per tablet 1 tablet (1 tablet Oral Given 04/10/22 0428)  clonazePAM (KLONOPIN) tablet 0.5 mg (0.5 mg Oral Given 04/10/22 0428)  ketorolac (TORADOL) 15 MG/ML injection 15 mg (15 mg Intravenous Given 04/10/22 2119)    ED Course/ Medical Decision Making/ A&P                           Medical Decision Making Amount and/or Complexity of Data Reviewed Labs: ordered. Radiology: ordered.  Risk Prescription drug management.   Patient with history of hypertension,  diabetes, costochondritis here for evaluation of orthopnea as well as flareup of her ongoing chest pain.  Pain in her chest is reproducible on examination.  EKG with left bundle branch block.  This has been present on prior EKGs.  Troponin is negative, D-dimer is negative.  CBC with mild leukocytosis-this is slightly improved when compared to priors.  Chest x-ray without acute infiltrate or pulmonary edema, images personally reviewed.  Current clinical picture is not consistent with ACS, PE, dissection, pneumonia, mediastinitis.  Discussed with patient home care for chest wall pain.  Her symptoms did slightly improve during her ED stay.  Plan to discharge with outpatient follow-up and return precautions.  Final Clinical Impression(s) / ED Diagnoses Final diagnoses:  Chest wall pain  SOB (shortness of breath)    Rx / DC Orders ED Discharge Orders     None         Tilden Fossa, MD 04/10/22 [phone]

## 2022-04-10 NOTE — ED Notes (Signed)
Discharge instructions were discussed with pt. Pt was able to verbalized understanding with no further questions. Pt called ride home. Pt to be picked up by son.   Pt currently med hold following IV administration of Toradol.

## 2022-04-10 NOTE — ED Triage Notes (Signed)
Pt states that she has SHOB when sitting or lying down.

## 2022-04-10 NOTE — ED Notes (Signed)
EDP Rees at bedside.  

## 2022-05-06 ENCOUNTER — Ambulatory Visit
Admission: RE | Admit: 2022-05-06 | Discharge: 2022-05-06 | Disposition: A | Discharge status: Home / Self Care | Payer: Commercial Managed Care - HMO | Source: Ambulatory Visit | Attending: Nurse Practitioner | Admitting: Nurse Practitioner

## 2022-05-06 ENCOUNTER — Other Ambulatory Visit: Payer: Self-pay | Admitting: Nurse Practitioner

## 2022-05-06 DIAGNOSIS — Z1231 Encounter for screening mammogram for malignant neoplasm of breast: Secondary | ICD-10-CM

## 2022-06-19 ENCOUNTER — Encounter: Payer: Self-pay | Admitting: Internal Medicine

## 2022-06-19 ENCOUNTER — Ambulatory Visit: Payer: Commercial Managed Care - HMO | Attending: Internal Medicine | Admitting: Internal Medicine

## 2022-06-19 VITALS — BP 130/78 | HR 80 | Ht 65.0 in | Wt 209.0 lb

## 2022-06-19 DIAGNOSIS — E782 Mixed hyperlipidemia: Secondary | ICD-10-CM | POA: Insufficient documentation

## 2022-06-19 DIAGNOSIS — I700 Atherosclerosis of aorta: Secondary | ICD-10-CM | POA: Insufficient documentation

## 2022-06-19 DIAGNOSIS — I447 Left bundle-branch block, unspecified: Secondary | ICD-10-CM | POA: Insufficient documentation

## 2022-06-19 DIAGNOSIS — I10 Essential (primary) hypertension: Secondary | ICD-10-CM

## 2022-06-19 NOTE — Progress Notes (Signed)
Cardiology Office Note:    Date:  06/19/2022   ID:  Desiree Mckinney, DOB July 05, 1961, MRN [id number]  PCP:  Valerie Roys, FNP   [facility] Providers Cardiologist:  Christell Constant, MD Cardiology APP:  Kennon Rounds     Referring MD: Valerie Roys,*   CC: Follow up secondary prevention  History of Present Illness:    Desiree Mckinney is a 61 y.o. female with a hx of HTN with DM, HLD and aortic atherosclerosis, intermittent LBBB whow as seen for CP in 2022.  Had  SVT with aberrancy during stress testing in 2022. Has MSK pain and was seen 05/2021 with no issues.  2023: LBBB.  Patient notes that she is doing fine.   Since last visit notes that family has been stressful.   Has issues with costocondritis. She is taking care of both sisters and her dad and has a lot of stress. There are no interval hospital/ED visit.    No SOB/DOE and no PND/Orthopnea.  No weight gain or leg swelling.  No palpitations or syncope.  Past Medical History:  Diagnosis Date   Anxiety    Arthritis    Costochondritis    Diabetes (HCC)    GERD (gastroesophageal reflux disease)    Hyperlipidemia    Hypertension    MMT (medial meniscus tear)    left   Seasonal allergies    Sleep apnea    does not use CPAP or oral appliance    Past Surgical History:  Procedure Laterality Date   ceasarean section  1993   CHONDROPLASTY Right 07/16/2018   Procedure: CHONDROPLASTY;  Surgeon: Bjorn Pippin, MD;  Location: [facility];  Service: Orthopedics;  Laterality: Right;   KNEE ARTHROSCOPY WITH MEDIAL MENISECTOMY Right 07/16/2018   Procedure: RIGHT ARTHROSCOPY KNEE CHONDROPLASTY, MEDIALMENISCECTOMY;  Surgeon: Bjorn Pippin, MD;  Location: [facility];  Service: Orthopedics;  Laterality: Right;   KNEE ARTHROSCOPY WITH MEDIAL MENISECTOMY Left 07/05/2021   Procedure: KNEE ARTHROSCOPY WITH MEDIAL MENISECTOMY;  Surgeon: Bjorn Pippin, MD;   Location: [facility];  Service: Orthopedics;  Laterality: Left;   SPLENECTOMY  childhood    Current Medications: Current Meds  Medication Sig   atenolol (TENORMIN) 25 MG tablet Take 25 mg by mouth every evening.   clonazePAM (KLONOPIN) 0.5 MG tablet Take 0.5 mg by mouth 2 (two) times daily as needed for anxiety.   fexofenadine (ALLEGRA) 60 MG tablet Take 60 mg by mouth daily.   ibuprofen (ADVIL) 800 MG tablet as needed for pain.   lisinopril (ZESTRIL) 10 MG tablet Take 1 tablet (10 mg total) by mouth daily.   metFORMIN (GLUCOPHAGE-XR) 500 MG 24 hr tablet Take 500 mg by mouth every evening.   pravastatin (PRAVACHOL) 20 MG tablet Take 20 mg by mouth every evening.   traMADol (ULTRAM) 50 MG tablet Take 50 mg by mouth 2 (two) times daily as needed for moderate pain.     Allergies:   Buspar [buspirone], Effexor [venlafaxine], Metformin and related, and Aspirin   Social History   Socioeconomic History   Marital status: Widowed    Spouse name: Not on file   Number of children: Not on file   Years of education: Not on file   Highest education level: Not on file  Occupational History   Not on file  Tobacco Use   Smoking status: Former    Types: Cigarettes    Quit date: 09/16/1984  Years since quitting: 37.7   Smokeless tobacco: Never  Substance and Sexual Activity   Alcohol use: Not Currently   Drug use: No   Sexual activity: Not on file  Other Topics Concern   Not on file  Social History Narrative   Not on file   Social Determinants of Health   Financial Resource Strain: Not on file  Food Insecurity: Not on file  Transportation Needs: Not on file  Physical Activity: Not on file  Stress: Not on file  Social Connections: Not on file     Family History: The patient's family history includes Diabetes in her father; Heart failure in her father; Hyperlipidemia in her father; Hypertension in her father. There is no history of Colon cancer.  ROS:   Please see  the history of present illness.     All other systems reviewed and are negative.  EKGs/Labs/Other Studies Reviewed:    The following studies were reviewed today:   EKG:  EKG is  ordered today.  The ekg ordered today demonstrates  06/19/22: SR LBBB  ECHO COMPLETE WO IMAGING ENHANCING AGENT 04/27/2021  Narrative ECHOCARDIOGRAM REPORT    Patient Name:   Desiree Mckinney Date of Exam: 04/27/2021 Medical Rec #:  [id number]          Height:       65.0 in Accession #:    [id number]         Weight:       215.0 lb Date of Birth:  Nov 13, 1960          BSA:          2.040 m Patient Age:    59 years           BP:           99/73 mmHg Patient Gender: F                  HR:           96 bpm. Exam Location:  Inpatient  Procedure: 2D Echo, Cardiac Doppler and Color Doppler  Indications:    R07.9* Chest pain, unspecified  History:        Patient has no prior history of Echocardiogram examinations. Risk Factors:Hypertension, Diabetes, Dyslipidemia, Sleep Apnea and GERD.  Sonographer:    Tiffany Dance RVT Referring Phys: [id number] STEPHANIE FALK MARTIN  IMPRESSIONS   1. Left ventricular ejection fraction, by estimation, is 60 to 65%. The left ventricle has normal function. The left ventricle has no regional wall motion abnormalities. Left ventricular diastolic parameters are consistent with Grade I diastolic dysfunction (impaired relaxation). 2. Right ventricular systolic function is normal. The right ventricular size is normal. 3. The mitral valve is normal in structure. Trivial mitral valve regurgitation. No evidence of mitral stenosis. 4. The aortic valve is normal in structure. Aortic valve regurgitation is not visualized. No aortic stenosis is present. 5. The inferior vena cava is normal in size with greater than 50% respiratory variability, suggesting right atrial pressure of 3 mmHg.  FINDINGS Left Ventricle: Left ventricular ejection fraction, by estimation, is 60 to 65%. The left  ventricle has normal function. The left ventricle has no regional wall motion abnormalities. The left ventricular internal cavity size was normal in size. There is no left ventricular hypertrophy. Left ventricular diastolic parameters are consistent with Grade I diastolic dysfunction (impaired relaxation).  Right Ventricle: The right ventricular size is normal. No increase in right ventricular wall thickness. Right ventricular systolic function is normal.  Left Atrium: Left atrial size was normal in size.  Right Atrium: Right atrial size was normal in size.  Pericardium: There is no evidence of pericardial effusion.  Mitral Valve: The mitral valve is normal in structure. Trivial mitral valve regurgitation. No evidence of mitral valve stenosis.  Tricuspid Valve: The tricuspid valve is normal in structure. Tricuspid valve regurgitation is not demonstrated. No evidence of tricuspid stenosis.  Aortic Valve: The aortic valve is normal in structure. Aortic valve regurgitation is not visualized. No aortic stenosis is present.  Pulmonic Valve: The pulmonic valve was normal in structure. Pulmonic valve regurgitation is not visualized. No evidence of pulmonic stenosis.  Aorta: The aortic root is normal in size and structure.  Venous: The inferior vena cava is normal in size with greater than 50% respiratory variability, suggesting right atrial pressure of 3 mmHg.  IAS/Shunts: No atrial level shunt detected by color flow Doppler.   LEFT VENTRICLE PLAX 2D LVIDd:         4.70 cm     Diastology LVIDs:         3.10 cm     LV e' medial:    8.16 cm/s LV PW:         1.00 cm     LV E/e' medial:  10.4 LV IVS:        0.80 cm     LV e' lateral:   9.14 cm/s LV E/e' lateral: 9.3  LV Volumes (MOD) LV vol d, MOD A2C: 64.2 ml LV vol d, MOD A4C: 70.5 ml LV vol s, MOD A2C: 20.1 ml LV vol s, MOD A4C: 23.5 ml LV SV MOD A2C:     44.1 ml LV SV MOD A4C:     70.5 ml LV SV MOD BP:      45.6 ml  RIGHT  VENTRICLE             IVC RV Basal diam:  2.90 cm     IVC diam: 1.20 cm RV S prime:     13.40 cm/s TAPSE (M-mode): 1.8 cm  LEFT ATRIUM             Index       RIGHT ATRIUM          Index LA diam:        3.90 cm 1.91 cm/m  RA Area:     9.45 cm LA Vol (A2C):   60.4 ml 29.61 ml/m RA Volume:   16.70 ml 8.19 ml/m LA Vol (A4C):   37.2 ml 18.24 ml/m LA Biplane Vol: 48.9 ml 23.97 ml/m AORTIC VALVE LVOT Vmax:   100.20 cm/s LVOT Vmean:  66.300 cm/s LVOT VTI:    0.206 m  AORTA Ao Asc diam: 2.70 cm  MITRAL VALVE MV Area (PHT): 3.54 cm    SHUNTS MV Decel Time: 214 msec    Systemic VTI: 0.21 m MV E velocity: 84.60 cm/s MV A velocity: 77.10 cm/s MV E/A ratio:  1.10  Donato Schultz MD Electronically signed by Donato Schultz MD Signature Date/Time: 04/27/2021/12:26:55 PM    Final     Recent Labs: 04/10/2022: ALT 11; B Natriuretic Peptide 85.0; BUN 12; Creatinine, Ser 0.61; Hemoglobin 11.8; Platelets 280; Potassium 3.5; Sodium 146  Recent Lipid Panel No results found for: "CHOL", "TRIG", "HDL", "CHOLHDL", "VLDL", "LDLCALC", "LDLDIRECT"    Physical Exam:    VS:  BP 130/78   Pulse 80   Ht 5\' 5"  (1.651 m)   Wt 209 lb (94.8 kg)  LMP 05/17/2012 (Approximate)   SpO2 97%   BMI 34.78 kg/m     Wt Readings from Last 3 Encounters:  06/19/22 209 lb (94.8 kg)  04/10/22 210 lb (95.3 kg)  07/05/21 217 lb 13 oz (98.8 kg)    GEN:  Well nourished, well developed in no acute distress HEENT: Normal NECK: No JVD; No carotid bruits LYMPHATICS: No lymphadenopathy CARDIAC: RRR, no murmurs, rubs, gallops RESPIRATORY:  Clear to auscultation without rales, wheezing or rhonchi  ABDOMEN: Soft, non-tender, non-distended MUSCULOSKELETAL:  No edema; No deformity  SKIN: Warm and dry NEUROLOGIC:  Alert and oriented x 3 PSYCHIATRIC:  Normal affect   ASSESSMENT:    1. LBBB (left bundle branch block)   2. Essential hypertension   3. Mixed hyperlipidemia   4. Aortic atherosclerosis (HCC)     PLAN:    HTN with DM - controlled on current therapy  Aortic atherosclerosis HLD - LDL goal < 70, she has repeat labs on 06/27/22, may increase statin to 20 mg - reviewed old CTPE with patient  P-SVT with LBBB - discussed LBBB and DDX - no symptoms no CP - will not plans new ischemic work up     PRN f/u unless FNP Owens Shark would prefer yearly    Medication Adjustments/Labs and Tests Ordered: Current medicines are reviewed at length with the patient today.  Concerns regarding medicines are outlined above.  Orders Placed This Encounter  Procedures   EKG 12-Lead   No orders of the defined types were placed in this encounter.   Patient Instructions  Medication Instructions:  Your physician recommends that you continue on your current medications as directed. Please refer to the Current Medication list given to you today.  *If you need a refill on your cardiac medications before your next appointment, please call your pharmacy*   Lab Work: NONE If you have labs (blood work) drawn today and your tests are completely normal, you will receive your results only by: [place] (if you have MyChart) OR A paper copy in the mail If you have any lab test that is abnormal or we need to change your treatment, we will call you to review the results.   Testing/Procedures: NONE   Follow-Up:As Needed  At [facility], you and your health needs are our priority.  As part of our continuing mission to provide you with exceptional heart care, we have created designated Provider Care Teams.  These Care Teams include your primary Cardiologist (physician) and Advanced Practice Providers (APPs -  Physician Assistants and Nurse Practitioners) who all work together to provide you with the care you need, when you need it.  Provider:   Werner Lean, MD      Important Information About Sugar         Signed, Werner Lean, MD  06/19/2022 6:19 PM    [place]

## 2022-06-19 NOTE — Patient Instructions (Signed)
Medication Instructions:  Your physician recommends that you continue on your current medications as directed. Please refer to the Current Medication list given to you today.  *If you need a refill on your cardiac medications before your next appointment, please call your pharmacy*   Lab Work: NONE If you have labs (blood work) drawn today and your tests are completely normal, you will receive your results only by: [place] (if you have MyChart) OR A paper copy in the mail If you have any lab test that is abnormal or we need to change your treatment, we will call you to review the results.   Testing/Procedures: NONE   Follow-Up:As Needed  At [facility], you and your health needs are our priority.  As part of our continuing mission to provide you with exceptional heart care, we have created designated Provider Care Teams.  These Care Teams include your primary Cardiologist (physician) and Advanced Practice Providers (APPs -  Physician Assistants and Nurse Practitioners) who all work together to provide you with the care you need, when you need it.  Provider:   Werner Lean, MD      Important Information About Sugar

## 2022-07-14 ENCOUNTER — Encounter (HOSPITAL_COMMUNITY): Payer: Self-pay | Admitting: *Deleted

## 2022-07-14 ENCOUNTER — Other Ambulatory Visit: Payer: Self-pay

## 2022-07-14 ENCOUNTER — Ambulatory Visit (HOSPITAL_COMMUNITY)
Admission: EM | Admit: 2022-07-14 | Discharge: 2022-07-14 | Disposition: A | Discharge status: Home / Self Care | Payer: Commercial Managed Care - HMO | Attending: Family Medicine | Admitting: Family Medicine

## 2022-07-14 ENCOUNTER — Ambulatory Visit (INDEPENDENT_AMBULATORY_CARE_PROVIDER_SITE_OTHER): Payer: Commercial Managed Care - HMO

## 2022-07-14 DIAGNOSIS — M545 Low back pain, unspecified: Secondary | ICD-10-CM | POA: Diagnosis not present

## 2022-07-14 DIAGNOSIS — M5441 Lumbago with sciatica, right side: Secondary | ICD-10-CM

## 2022-07-14 DIAGNOSIS — M5442 Lumbago with sciatica, left side: Secondary | ICD-10-CM | POA: Diagnosis not present

## 2022-07-14 MED ORDER — KETOROLAC TROMETHAMINE 30 MG/ML IJ SOLN
30.0000 mg | Freq: Once | INTRAMUSCULAR | Status: AC
  Administered 2022-07-14: 30 mg via INTRAMUSCULAR

## 2022-07-14 MED ORDER — PREDNISONE 20 MG PO TABS: 40.0000 mg | ORAL_TABLET | Freq: Every day | ORAL | 0 refills | Status: AC

## 2022-07-14 MED ORDER — KETOROLAC TROMETHAMINE 30 MG/ML IJ SOLN
INTRAMUSCULAR | Status: AC
  Filled 2022-07-14: qty 1

## 2022-07-14 NOTE — ED Triage Notes (Signed)
Pt reports bil buttocks and bil posterior leg pain for 3 days. Pt reports she took a Ultram last night.

## 2022-07-14 NOTE — Discharge Instructions (Addendum)
Your x-ray showed some degenerative changes at L4-L5.  You have been given a shot of Toradol 30 mg today.  Take prednisone 20 mg--2 daily for 3 days  Continue to take the tramadol that you have at home every 6 hours as needed for pain.  Please follow-up with your primary care early this week about this issue

## 2022-07-14 NOTE — ED Provider Notes (Addendum)
[facility]    CSN: [id number] Arrival date & time: 07/14/22  1002      History   Chief Complaint Chief Complaint  Patient presents with   Back Pain   Leg Pain    HPI CLAIRESSA BOULET is a 61 y.o. female.    Back Pain Associated symptoms: leg pain   Leg Pain Associated symptoms: back pain    Here for bilateral low back pain.  It is mainly at her waist and buttocks bilaterally and then it radiates down her legs bilaterally.  It also will tingle and itch a little bit.  Flexion at the waist and sitting makes it worse.  The Ultram she took last night did help.  She does have a history of diabetes but her A1c was 6.1 when last done.  She does not take medication for it  No fall or trauma, and no fever or chills.  No dysuria or hematuria.  No abdominal pain and no vomiting or nausea.  No new B/B incontinence  Past Medical History:  Diagnosis Date   Anxiety    Arthritis    Costochondritis    Diabetes (HCC)    GERD (gastroesophageal reflux disease)    Hyperlipidemia    Hypertension    MMT (medial meniscus tear)    left   Seasonal allergies    Sleep apnea    does not use CPAP or oral appliance    Patient Active Problem List   Diagnosis Date Noted   LBBB (left bundle branch block) 06/19/2022   Mixed hyperlipidemia 06/19/2022   Aortic atherosclerosis ([place]) 06/19/2022   Elevated troponin 04/27/2021   Essential hypertension 04/27/2021   Diabetes mellitus type 2 in obese ([place]) 04/27/2021   Post traumatic stress disorder (PTSD) 11/08/2019   Bruxism (teeth grinding) 11/08/2019   Arthropathy of both temporomandibular joints 11/08/2019   Loud snoring 11/08/2019   Morbid obesity ([place]) 11/08/2019   Psychophysiological insomnia 11/08/2019    Past Surgical History:  Procedure Laterality Date   ceasarean section  1993   CHONDROPLASTY Right 07/16/2018   Procedure: CHONDROPLASTY;  Surgeon: Hiram Gash, MD;  Location: [facility];  Service:  Orthopedics;  Laterality: Right;   KNEE ARTHROSCOPY WITH MEDIAL MENISECTOMY Right 07/16/2018   Procedure: RIGHT ARTHROSCOPY KNEE CHONDROPLASTY, MEDIALMENISCECTOMY;  Surgeon: Hiram Gash, MD;  Location: [facility];  Service: Orthopedics;  Laterality: Right;   KNEE ARTHROSCOPY WITH MEDIAL MENISECTOMY Left 07/05/2021   Procedure: KNEE ARTHROSCOPY WITH MEDIAL MENISECTOMY;  Surgeon: Hiram Gash, MD;  Location: [facility];  Service: Orthopedics;  Laterality: Left;   SPLENECTOMY  childhood    OB History   No obstetric history on file.      Home Medications    Prior to Admission medications   Medication Sig Start Date End Date Taking? Authorizing Provider  predniSONE (DELTASONE) 20 MG tablet Take 2 tablets (40 mg total) by mouth daily with breakfast for 3 days. 07/14/22 07/17/22 Yes Barrett Henle, MD  atenolol (TENORMIN) 25 MG tablet Take 25 mg by mouth every evening.    [provider]  clonazePAM (KLONOPIN) 0.5 MG tablet Take 0.5 mg by mouth 2 (two) times daily as needed for anxiety.    [provider]  fexofenadine (ALLEGRA) 60 MG tablet Take 60 mg by mouth daily.    [provider]  lisinopril (ZESTRIL) 10 MG tablet Take 1 tablet (10 mg total) by mouth daily. 04/29/21   Hosie Poisson, MD  metFORMIN (GLUCOPHAGE-XR) 500 MG 24 hr tablet Take 500 mg by mouth every evening. 04/10/21   [provider]  pravastatin (PRAVACHOL) 20 MG tablet Take 20 mg by mouth every evening.    [provider]  traMADol (ULTRAM) 50 MG tablet Take 50 mg by mouth 2 (two) times daily as needed for moderate pain. 03/21/22   [provider]    Family History Family History  Problem Relation Age of Onset   Diabetes Father    Heart failure Father    Hypertension Father    Hyperlipidemia Father    Colon cancer Neg Hx     Social History Social History   Tobacco Use   Smoking status: Former    Types: Cigarettes    Quit date:  09/16/1984    Years since quitting: 37.8   Smokeless tobacco: Never  Substance Use Topics   Alcohol use: Not Currently   Drug use: No     Allergies   Buspar [buspirone], Effexor [venlafaxine], Metformin and related, and Aspirin   Review of Systems Review of Systems  Musculoskeletal:  Positive for back pain.     Physical Exam Triage Vital Signs ED Triage Vitals  Enc Vitals Group     BP 07/14/22 1029 (!) 138/58     Pulse Rate 07/14/22 1029 76     Resp 07/14/22 1029 18     Temp 07/14/22 1029 98.8 F (37.1 C)     Temp src --      SpO2 07/14/22 1029 96 %     Weight --      Height --      Head Circumference --      Peak Flow --      Pain Score 07/14/22 1027 4     Pain Loc --      Pain Edu? --      Excl. in GC? --    No data found.  Updated Vital Signs BP (!) 138/58   Pulse 76   Temp 98.8 F (37.1 C)   Resp 18   LMP 05/17/2012 (Approximate)   SpO2 96%   Visual Acuity Right Eye Distance:   Left Eye Distance:   Bilateral Distance:    Right Eye Near:   Left Eye Near:    Bilateral Near:     Physical Exam Vitals reviewed.  Constitutional:      General: She is not in acute distress.    Appearance: She is not ill-appearing, toxic-appearing or diaphoretic.  HENT:     Nose: Nose normal.     Mouth/Throat:     Mouth: Mucous membranes are moist.     Pharynx: No oropharyngeal exudate or posterior oropharyngeal erythema.  Eyes:     Extraocular Movements: Extraocular movements intact.     Conjunctiva/sclera: Conjunctivae normal.     Pupils: Pupils are equal, round, and reactive to light.  Cardiovascular:     Rate and Rhythm: Normal rate and regular rhythm.     Heart sounds: No murmur heard. Pulmonary:     Effort: Pulmonary effort is normal.     Breath sounds: No stridor. No wheezing, rhonchi or rales.  Abdominal:     Palpations: Abdomen is soft.     Tenderness: There is no abdominal tenderness.  Musculoskeletal:     Cervical back: Neck supple.     Comments:  She is tender some in the LS area.  There is no rash there.  Straight leg raise does cause pain to radiate down to  her lower calves bilaterally.  Neurological:     General: No focal deficit present.     Mental Status: She is alert and oriented to person, place, and time.  Psychiatric:        Behavior: Behavior normal.      UC Treatments / Results  Labs (all labs ordered are listed, but only abnormal results are displayed) Labs Reviewed - No data to display  EKG   Radiology DG Lumbar Spine 2-3 Views  Result Date: 07/14/2022 CLINICAL DATA:  61 year old female with sudden onset low back pain three days ago. EXAM: LUMBAR SPINE - 2-3 VIEW COMPARISON:  None Available. FINDINGS: Normal lumbar segmentation. Straightening of lumbar lordosis. Slight levoconvex lumbar scoliosis. No significant spondylolisthesis. Relatively preserved disc spaces. Moderate degenerative endplate spurring at L4-L5. Grossly intact visible sacrum. No acute osseous abnormality identified. Grossly negative visible lung bases, abdominal and pelvic visceral contours. IMPRESSION: 1. No acute osseous abnormality identified in the lumbar spine. 2. Preserved disc spaces but degenerative endplate spurring at L4-L5. Electronically Signed   By: Odessa Fleming M.D.   On: 07/14/2022 11:27    Procedures Procedures (including critical care time)  Medications Ordered in UC Medications  ketorolac (TORADOL) 30 MG/ML injection 30 mg (has no administration in time range)    Initial Impression / Assessment and Plan / UC Course  I have reviewed the triage vital signs and the nursing notes.  Pertinent labs & imaging results that were available during my care of the patient were reviewed by me and considered in my medical decision making (see chart for details).        No acute bony abnormalities on the x-rays, but there are some degenerative changes in the L4-L5 region.  She states she has plenty of tramadol at home.  3 days of  prednisone is sent in to see if that will help any inflammation.  She is warned of possible increase in her sugars, and she will be good on her diet and drink plan fluids.  She will follow-up with her primary care early this week.  We discussed some of the symptoms sound like there is compression of a nerve somewhere. Final Clinical Impressions(s) / UC Diagnoses   Final diagnoses:  Acute bilateral low back pain with bilateral sciatica     Discharge Instructions      Your x-ray showed some degenerative changes at L4-L5.  You have been given a shot of Toradol 30 mg today.  Take prednisone 20 mg--2 daily for 3 days  Continue to take the tramadol that you have at home every 6 hours as needed for pain.  Please follow-up with your primary care early this week about this issue      ED Prescriptions     Medication Sig Dispense Auth. Provider   predniSONE (DELTASONE) 20 MG tablet Take 2 tablets (40 mg total) by mouth daily with breakfast for 3 days. 6 tablet Khloee Garza, Janace Aris, MD      I have reviewed the PDMP during this encounter.   Zenia Resides, MD 07/14/22 1141    Zenia Resides, MD 07/14/22 1141

## 2023-01-29 ENCOUNTER — Encounter: Payer: Self-pay | Admitting: Physician Assistant

## 2023-04-14 ENCOUNTER — Other Ambulatory Visit: Payer: Self-pay | Admitting: Physician Assistant

## 2023-04-14 ENCOUNTER — Other Ambulatory Visit (HOSPITAL_COMMUNITY): Payer: Self-pay

## 2023-04-14 ENCOUNTER — Encounter: Payer: Self-pay | Admitting: Physician Assistant

## 2023-04-14 ENCOUNTER — Ambulatory Visit (INDEPENDENT_AMBULATORY_CARE_PROVIDER_SITE_OTHER): Payer: 59 | Admitting: Physician Assistant

## 2023-04-14 ENCOUNTER — Telehealth: Payer: Self-pay

## 2023-04-14 VITALS — BP 126/68 | HR 72 | Ht 65.0 in | Wt 214.0 lb

## 2023-04-14 DIAGNOSIS — Z1211 Encounter for screening for malignant neoplasm of colon: Secondary | ICD-10-CM

## 2023-04-14 DIAGNOSIS — K219 Gastro-esophageal reflux disease without esophagitis: Secondary | ICD-10-CM

## 2023-04-14 DIAGNOSIS — R143 Flatulence: Secondary | ICD-10-CM

## 2023-04-14 DIAGNOSIS — M940 Chondrocostal junction syndrome [Tietze]: Secondary | ICD-10-CM

## 2023-04-14 DIAGNOSIS — R1013 Epigastric pain: Secondary | ICD-10-CM

## 2023-04-14 MED ORDER — OMEPRAZOLE 20 MG PO CPDR: 20.0000 mg | DELAYED_RELEASE_CAPSULE | Freq: Two times a day (BID) | ORAL | 5 refills | Status: DC

## 2023-04-14 MED ORDER — NA SULFATE-K SULFATE-MG SULF 17.5-3.13-1.6 GM/177ML PO SOLN: 1.0000 | Freq: Once | ORAL | 0 refills | Status: AC

## 2023-04-14 NOTE — Patient Instructions (Signed)
We have sent the following medications to your pharmacy for you to pick up at your convenience:  Omeprazole - twice a day.  You have been scheduled for an endoscopy and colonoscopy. Please follow the written instructions given to you at your visit today.  Please pick up your prep supplies at the pharmacy within the next 1-3 days.  If you use inhalers (even only as needed), please bring them with you on the day of your procedure.  DO NOT TAKE 7 DAYS PRIOR TO TEST- Trulicity (dulaglutide) Ozempic, Wegovy (semaglutide) Mounjaro (tirzepatide) Bydureon Bcise (exanatide extended release)  DO NOT TAKE 1 DAY PRIOR TO YOUR TEST Rybelsus (semaglutide) Adlyxin (lixisenatide) Victoza (liraglutide) Byetta (exanatide) ___________________________________________________________________________ _______________________________________________________  If your blood pressure at your visit was 140/90 or greater, please contact your primary care physician to follow up on this.  _______________________________________________________  If you are age 11 or older, your body mass index should be between 23-30. Your Body mass index is 35.61 kg/m. If this is out of the aforementioned range listed, please consider follow up with your Primary Care Provider.  If you are age 75 or younger, your body mass index should be between 19-25. Your Body mass index is 35.61 kg/m. If this is out of the aformentioned range listed, please consider follow up with your Primary Care Provider.   ________________________________________________________  The [place] [facility] providers would like to encourage you to use [facility] to communicate with providers for non-urgent requests or questions.  Due to long hold times on the telephone, sending your provider a message by [facility] may be a faster and more efficient way to get a response.  Please allow 48 business hours for a response.  Please remember that this is for non-urgent requests.   _______________________________________________________

## 2023-04-14 NOTE — Progress Notes (Signed)
Chief Complaint: "Severe gas", GERD, screening for colorectal cancer  HPI:    Desiree Mckinney is a 62 year old female with a past medical history as listed below, including GERD (04/27/2021 echo with LVEF 60-65%) and hypertension, who was referred to me by Valerie Roys,* for a complaint of "severe gas", GERD, screening for colorectal cancer.    12/26/2022 hemoglobin A1c 6.3.    Today, the patient tells me that she has chronic costochondritis, apparently always has pain along her sternum and has been evaluated multiple times in her life for this and always been told it is costochondritis and there is not much she can do.  Due to this pain she will often use Ibuprofen and Tramadol daily, sometimes 1-2 times a day, tells me she only takes it though when the pain is super bad which is usually maybe a week or 2 out of the month.  Most concerning to her is that she has developed some new symptoms of a gas and bubbling and reflux symptoms especially when she lays down at night.  She is on Omeprazole 20 mg once daily and has been for years for typical reflux symptoms but all of this is new.    Also tells me she knows she is due for a screening colonoscopy and would like to get that done at the same time.    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    Denies fever, chills, weight loss, blood in her stool, nausea, vomiting or symptoms that awaken her from sleep.  Past Medical History:  Diagnosis Date   Anxiety    Arthritis    Costochondritis    Diabetes (HCC)    GERD (gastroesophageal reflux disease)    Hyperlipidemia    Hypertension    MMT (medial meniscus tear)    left   Seasonal allergies    Sleep apnea    does not use CPAP or oral appliance    Past Surgical History:  Procedure Laterality Date   ceasarean section  1993   CHONDROPLASTY Right 07/16/2018   Procedure: CHONDROPLASTY;  Surgeon: Bjorn Pippin, MD;  Location:  [facility];  Service: Orthopedics;  Laterality: Right;   KNEE ARTHROSCOPY WITH MEDIAL MENISECTOMY Right 07/16/2018   Procedure: RIGHT ARTHROSCOPY KNEE CHONDROPLASTY, MEDIALMENISCECTOMY;  Surgeon: Bjorn Pippin, MD;  Location: [facility];  Service: Orthopedics;  Laterality: Right;   KNEE ARTHROSCOPY WITH MEDIAL MENISECTOMY Left 07/05/2021   Procedure: KNEE ARTHROSCOPY WITH MEDIAL MENISECTOMY;  Surgeon: Bjorn Pippin, MD;  Location: [facility];  Service: Orthopedics;  Laterality: Left;   SPLENECTOMY  childhood    Current Outpatient Medications  Medication Sig Dispense Refill   atenolol (TENORMIN) 25 MG tablet Take 25 mg by mouth every evening.     clonazePAM (KLONOPIN) 0.5 MG tablet Take 0.5 mg by mouth 2 (two) times daily as needed for anxiety.     fexofenadine (ALLEGRA) 60 MG tablet Take 60 mg by mouth daily.     ibuprofen (ADVIL) 200 MG tablet Take 200 mg by mouth every 6 (six) hours as needed.     lisinopril (ZESTRIL) 10 MG tablet Take 1 tablet (10 mg total) by mouth daily. 30 tablet 1   metFORMIN (GLUCOPHAGE-XR) 500 MG 24 hr tablet Take 500 mg by mouth every evening.     omeprazole (PRILOSEC) 20 MG capsule Take 20 mg by mouth daily.  pravastatin (PRAVACHOL) 20 MG tablet Take 20 mg by mouth every evening.     traMADol (ULTRAM) 50 MG tablet Take 50 mg by mouth 2 (two) times daily as needed for moderate pain.     No current facility-administered medications for this visit.    Allergies as of 04/14/2023 - Review Complete 04/14/2023  Allergen Reaction Noted   Buspar [buspirone] Other (See Comments) 04/10/2022   Effexor [venlafaxine] Other (See Comments) 04/10/2022   Metformin and related Diarrhea 04/10/2022   Aspirin Palpitations 05/18/2015    Family History  Problem Relation Age of Onset   Diabetes Father    Heart failure Father    Hypertension Father    Hyperlipidemia Father    Colon cancer Neg Hx     Social History    Socioeconomic History   Marital status: Widowed    Spouse name: Not on file   Number of children: Not on file   Years of education: Not on file   Highest education level: Not on file  Occupational History   Not on file  Tobacco Use   Smoking status: Former    Current packs/day: 0.00    Types: Cigarettes    Quit date: 09/16/1984    Years since quitting: 38.6   Smokeless tobacco: Never  Substance and Sexual Activity   Alcohol use: Not Currently   Drug use: No   Sexual activity: Not on file  Other Topics Concern   Not on file  Social History Narrative   Not on file   Social Determinants of Health   Financial Resource Strain: Not on File (01/03/2022)   Received from [employer], [employer]    Financial Resource Strain: 0  Food Insecurity: Not on File (01/03/2022)   Received from [place], [employer] Insecurity    Food: 0  Transportation Needs: Not on File (01/03/2022)   Received from [employer], [employer] Needs    Transportation: 0  Physical Activity: Not on File (01/03/2022)   Received from [place], [place]   Physical Activity    Physical Activity: 0  Stress: Not on File (01/03/2022)   Received from [facility], [place]   Stress    Stress: 0  Social Connections: Not on File (01/03/2022)   Received from [place], [place]   Social Connections    Social Connections and Isolation: 0  Intimate Partner Violence: Unknown (12/21/2021)   Received from [employer], [facility]   HITS    Physically Hurt: Not on file    Insult or Talk Down To: Not on file    Threaten Physical Harm: Not on file    Scream or Curse: Not on file    Review of Systems:    Constitutional: No weight loss, fever or chills Skin: No rash  Cardiovascular: +chronic atypical chest pain Respiratory: No SOB  Gastrointestinal: See HPI and otherwise negative Genitourinary: No dysuria Neurological: No headache, dizziness or syncope Musculoskeletal: No new muscle or joint pain Hematologic: No  bleeding  Psychiatric: No history of depression or anxiety   Physical Exam:  Vital signs: BP 126/68   Pulse 72   Ht 5\' 5"  (1.651 m)   Wt 214 lb (97.1 kg)   LMP 05/17/2012 (Approximate)   BMI 35.61 kg/m    Constitutional:   Pleasant overweight AA female appears to be in NAD, Well developed, Well nourished, alert and cooperative Head:  Normocephalic and atraumatic. Eyes:   PEERL, EOMI. No icterus. Conjunctiva pink. Ears:  Normal auditory  acuity. Neck:  Supple Throat: Oral cavity and pharynx without inflammation, swelling or lesion.  Respiratory: Respirations even and unlabored. Lungs clear to auscultation bilaterally.   No wheezes, crackles, or rhonchi.  Cardiovascular: Normal S1, S2. No MRG. Regular rate and rhythm. No peripheral edema, cyanosis or pallor.  Gastrointestinal:  Soft, nondistended, nontender. No rebound or guarding. Normal bowel sounds. No appreciable masses or hepatomegaly. Rectal:  Not performed.  Msk:  Symmetrical without gross deformities. Without edema, no deformity or joint abnormality. +severe ttp to only light palpation over sternum Neurologic:  Alert and  oriented x4;  grossly normal neurologically.  Skin:   Dry and intact without significant lesions or rashes. Psychiatric: Demonstrates good judgement and reason without abnormal affect or behaviors.  RELEVANT LABS AND IMAGING: CBC    Component Value Date/Time   WBC 10.9 (H) 04/10/2022 0410   RBC 3.86 (L) 04/10/2022 0410   HGB 11.8 (L) 04/10/2022 0410   HCT 35.8 (L) 04/10/2022 0410   PLT 280 04/10/2022 0410   MCV 92.7 04/10/2022 0410   MCH 30.6 04/10/2022 0410   MCHC 33.0 04/10/2022 0410   RDW 14.6 04/10/2022 0410   LYMPHSABS 3.4 04/10/2022 0410   MONOABS 0.6 04/10/2022 0410   EOSABS 0.0 04/10/2022 0410   BASOSABS 0.1 04/10/2022 0410    CMP     Component Value Date/Time   NA 146 (H) 04/10/2022 0410   K 3.5 04/10/2022 0410   CL 111 04/10/2022 0410   CO2 27 04/10/2022 0410   GLUCOSE 138 (H)  04/10/2022 0410   BUN 12 04/10/2022 0410   CREATININE 0.61 04/10/2022 0410   CALCIUM 9.5 04/10/2022 0410   PROT 7.9 04/10/2022 0410   ALBUMIN 3.9 04/10/2022 0410   AST 15 04/10/2022 0410   ALT 11 04/10/2022 0410   ALKPHOS 76 04/10/2022 0410   BILITOT 0.6 04/10/2022 0410   GFRNONAA >60 04/10/2022 0410   GFRAA >60 07/09/2018 0815    Assessment: 1.  GERD: Chronic, typically controlled on Omeprazole 20 mg daily over the past many years, but now with new symptoms of regurgitation, worse at night and excess gas; consider worsening gastritis +/- H. pylori versus other 2.  Chronic costochondritis: Chronic sternal chest pain worse if she is lifting heavy things excetra, treats with Ibuprofen and Tramadol, apparently evaluated extensively in the past; consider possible relation to above 3.  Screening for colorectal cancer: Patient is 89 and never had screening for colon cancer  Plan: 1.  Patient had very specific times and days that she was available for an EGD and colonoscopy.  She was scheduled for an EGD and colonoscopy with Dr. Leonides Schanz in the [facility].  Did provide the patient a detailed list of risks for the procedure and she agrees to proceed. 2.  Increased Omeprazole to 20 mg twice daily, 30-60 minutes before breakfast and dinner #60 with 5 refills. 3.  Told the patient to check in with me in the next couple of weeks, if not feeling any better would increase to 40 mg twice a day to see if this helps. 4.  Patient to follow in clinic per recommendations after time of procedures.  Hyacinth Meeker, PA-C [facility] 04/14/2023, 10:04 AM  Cc: Valerie Roys,*

## 2023-04-14 NOTE — Telephone Encounter (Signed)
*  Pulm  Pharmacy Patient Advocate Encounter   Received notification from CoverMyMeds that prior authorization for Omeprazole 20MG  dr capsules  is required/requested.   Insurance verification completed.   The patient is insured through [facility] .   Per test claim: PA required; PA submitted to [facility] via CoverMyMeds Key/confirmation #/[id number] Status is pending

## 2023-04-14 NOTE — Telephone Encounter (Signed)
Internal medication prior auth needed.

## 2023-04-14 NOTE — Telephone Encounter (Signed)
PA submitted and will be updated in additional encounter created.

## 2023-04-15 NOTE — Progress Notes (Signed)
I agree with the assessment and plan as outlined by Ms. Lemmon. 

## 2023-04-16 ENCOUNTER — Other Ambulatory Visit (HOSPITAL_COMMUNITY): Payer: Self-pay

## 2023-04-16 NOTE — Telephone Encounter (Signed)
PA was submitted for twice daily dosing. Status is still pending.

## 2023-04-18 NOTE — Telephone Encounter (Signed)
Pharmacy Patient Advocate Encounter   Received notification from CoverMyMeds that prior authorization for omeprazole 20mg  is required/requested.   Insurance verification completed.   The patient is insured through [facility] .   Per test claim: PA required; PA submitted to [facility] via CoverMyMeds Key/confirmation #/EOC [id number] Status is pending  Received a denial letter and resubmitted prior auth with documentation.

## 2023-04-24 NOTE — Telephone Encounter (Signed)
Pharmacy Patient Advocate Encounter  Received notification from [facility] that Prior Authorization for Omeprazole 20mg   has been APPROVED from 04/21/2023 to 04/20/2024  Approval letter has been attached in patients media

## 2023-05-02 ENCOUNTER — Telehealth: Payer: 59 | Admitting: Nurse Practitioner

## 2023-05-02 DIAGNOSIS — J329 Chronic sinusitis, unspecified: Secondary | ICD-10-CM

## 2023-05-02 DIAGNOSIS — B9789 Other viral agents as the cause of diseases classified elsewhere: Secondary | ICD-10-CM | POA: Diagnosis not present

## 2023-05-02 MED ORDER — IPRATROPIUM BROMIDE 0.03 % NA SOLN: 2.0000 | Freq: Two times a day (BID) | NASAL | 12 refills | Status: DC

## 2023-05-02 NOTE — Progress Notes (Signed)
 E-Visit for Sinus Problems  We are sorry that you are not feeling well.  Here is how we plan to help!  Based on what you have shared with me it looks like you have sinusitis.  Sinusitis is inflammation and infection in the sinus cavities of the head.  Based on your presentation I believe you most likely have Acute Viral Sinusitis.This is an infection most likely caused by a virus. There is not specific treatment for viral sinusitis other than to help you with the symptoms until the infection runs its course.  You may use an oral decongestant such as Mucinex D or if you have glaucoma or high blood pressure use plain Mucinex. Saline nasal spray help and can safely be used as often as needed for congestion, I have prescribed: Ipratropium Bromide nasal spray 0.03% 2 sprays in eah nostril 2-3 times a day  We do not recommend antibiotics prior to 7-10 days of symptoms Providers prescribe antibiotics to treat infections caused by bacteria. Antibiotics are very powerful in treating bacterial infections when they are used properly. To maintain their effectiveness, they should be used only when necessary. Overuse of antibiotics has resulted in the development of superbugs that are resistant to treatment!    After careful review of your answers, I would not recommend an antibiotic for your condition.  Antibiotics are not effective against viruses and therefore should not be used to treat them. Common examples of infections caused by viruses include colds and flu   Some authorities believe that zinc sprays or the use of Echinacea may shorten the course of your symptoms.  Sinus infections are not as easily transmitted as other respiratory infection, however we still recommend that you avoid close contact with loved ones, especially the very young and elderly.  Remember to wash your hands thoroughly throughout the day as this is the number one way to prevent the spread of infection!  Home Care: Only take  medications as instructed by your medical team. Do not take these medications with alcohol. A steam or ultrasonic humidifier can help congestion.  You can place a towel over your head and breathe in the steam from hot water coming from a faucet. Avoid close contacts especially the very young and the elderly. Cover your mouth when you cough or sneeze. Always remember to wash your hands.  Get Help Right Away If: You develop worsening fever or sinus pain. You develop a severe head ache or visual changes. Your symptoms persist after you have completed your treatment plan.  Make sure you Understand these instructions. Will watch your condition. Will get help right away if you are not doing well or get worse.   Thank you for choosing an e-visit.  Your e-visit answers were reviewed by a board certified advanced clinical practitioner to complete your personal care plan. Depending upon the condition, your plan could have included both over the counter or prescription medications.  Please review your pharmacy choice. Make sure the pharmacy is open so you can pick up prescription now. If there is a problem, you may contact your provider through [employer] and have the prescription routed to another pharmacy.  Your safety is important to [place]. If you have drug allergies check your prescription carefully.   For the next 24 hours you can use MyChart to ask questions about today's visit, request a non-urgent call back, or ask for a work or school excuse. You will get an email in the next two days asking about your  experience. I hope that your e-visit has been valuable and will speed your recovery.  Meds ordered this encounter  Medications   ipratropium (ATROVENT) 0.03 % nasal spray    Sig: Place 2 sprays into both nostrils every 12 (twelve) hours.    Dispense:  30 mL    Refill:  12    I spent approximately 5 minutes reviewing the patient's history, current symptoms and coordinating their care  today.

## 2023-05-06 ENCOUNTER — Other Ambulatory Visit (HOSPITAL_COMMUNITY): Payer: Self-pay

## 2023-06-06 ENCOUNTER — Encounter: Payer: Self-pay | Admitting: Internal Medicine

## 2023-06-20 ENCOUNTER — Other Ambulatory Visit: Payer: Self-pay | Admitting: Internal Medicine

## 2023-06-20 ENCOUNTER — Ambulatory Visit (AMBULATORY_SURGERY_CENTER): Payer: 59 | Admitting: Internal Medicine

## 2023-06-20 ENCOUNTER — Encounter: Payer: Self-pay | Admitting: Internal Medicine

## 2023-06-20 VITALS — BP 110/59 | HR 69 | Temp 97.7°F | Resp 18 | Ht 65.0 in | Wt 214.0 lb

## 2023-06-20 DIAGNOSIS — K317 Polyp of stomach and duodenum: Secondary | ICD-10-CM | POA: Diagnosis not present

## 2023-06-20 DIAGNOSIS — D123 Benign neoplasm of transverse colon: Secondary | ICD-10-CM

## 2023-06-20 DIAGNOSIS — D122 Benign neoplasm of ascending colon: Secondary | ICD-10-CM | POA: Diagnosis not present

## 2023-06-20 DIAGNOSIS — R143 Flatulence: Secondary | ICD-10-CM

## 2023-06-20 DIAGNOSIS — R12 Heartburn: Secondary | ICD-10-CM

## 2023-06-20 DIAGNOSIS — R1013 Epigastric pain: Secondary | ICD-10-CM

## 2023-06-20 DIAGNOSIS — K219 Gastro-esophageal reflux disease without esophagitis: Secondary | ICD-10-CM

## 2023-06-20 DIAGNOSIS — K319 Disease of stomach and duodenum, unspecified: Secondary | ICD-10-CM

## 2023-06-20 DIAGNOSIS — Z1211 Encounter for screening for malignant neoplasm of colon: Secondary | ICD-10-CM

## 2023-06-20 MED ORDER — SODIUM CHLORIDE 0.9 % IV SOLN: 500.0000 mL | Freq: Once | INTRAVENOUS | Status: AC

## 2023-06-20 MED ORDER — OMEPRAZOLE 20 MG PO CPDR: DELAYED_RELEASE_CAPSULE | ORAL | 1 refills | Status: DC

## 2023-06-20 NOTE — Op Note (Signed)
[facility] Patient Name: Desiree Mckinney Procedure Date: 06/20/2023 9:37 AM MRN: [id number] Endoscopist: Madelyn Brunner [place] , , [id number] Age: 62 Referring MD:  Date of Birth: [birth year] Gender: Female Account #: [id number] Procedure:                Upper GI endoscopy Indications:              Heartburn Medicines:                Monitored Anesthesia Care Procedure:                Pre-Anesthesia Assessment:                           - Prior to the procedure, a History and Physical                            was performed, and patient medications and                            allergies were reviewed. The patient's tolerance of                            previous anesthesia was also reviewed. The risks                            and benefits of the procedure and the sedation                            options and risks were discussed with the patient.                            All questions were answered, and informed consent                            was obtained. Prior Anticoagulants: The patient has                            taken no anticoagulant or antiplatelet agents. ASA                            Grade Assessment: II - A patient with mild systemic                            disease. After reviewing the risks and benefits,                            the patient was deemed in satisfactory condition to                            undergo the procedure.                           After obtaining informed consent, the endoscope was  passed under direct vision. Throughout the                            procedure, the patient's blood pressure, pulse, and                            oxygen saturations were monitored continuously. The                            Olympus Scope [id number] was introduced through the                            mouth, and advanced to the second part of duodenum.                            The upper GI endoscopy was  accomplished without                            difficulty. The patient tolerated the procedure                            well. Scope In: Scope Out: Findings:                 The examined esophagus was normal.                           Localized mildly erythematous mucosa without                            bleeding was found in the gastric antrum. Biopsies                            were taken with a cold forceps for histology.                           Localized mucosal variance characterized by                            congestion and granularity was found in the ampulla                            (prominent ampulla). Biopsies were taken with a                            cold forceps for histology. Complications:            No immediate complications. Estimated Blood Loss:     Estimated blood loss was minimal. Impression:               - Normal esophagus.                           - Erythematous mucosa in the antrum. Biopsied.                           -  Mucosal variant in the duodenum. Biopsied. Recommendation:           - Await pathology results.                           - Trial of omeprazole 40 mg BID for 8 weeks, then                            decrease to QD.                           - Return to GI clinic in 2-3 months.                           - Perform a colonoscopy today. Dr Particia Lather "[place]" Leonides Schanz,  06/20/2023 10:08:47 AM

## 2023-06-20 NOTE — Progress Notes (Signed)
Sedate, gd SR, tolerated procedure well, VSS, report to RN 

## 2023-06-20 NOTE — Op Note (Signed)
[facility] Patient Name: Desiree Mckinney Procedure Date: 06/20/2023 9:37 AM MRN: [id number] Endoscopist: Madelyn Brunner [place] , , [id number] Age: 62 Referring MD:  Date of Birth: 1960/09/30 Gender: Female Account #: [id number] Procedure:                Colonoscopy Indications:              Screening for colorectal malignant neoplasm, This                            is the patient's first colonoscopy Medicines:                Monitored Anesthesia Care Procedure:                Pre-Anesthesia Assessment:                           - Prior to the procedure, a History and Physical                            was performed, and patient medications and                            allergies were reviewed. The patient's tolerance of                            previous anesthesia was also reviewed. The risks                            and benefits of the procedure and the sedation                            options and risks were discussed with the patient.                            All questions were answered, and informed consent                            was obtained. Prior Anticoagulants: The patient has                            taken no anticoagulant or antiplatelet agents. ASA                            Grade Assessment: II - A patient with mild systemic                            disease. After reviewing the risks and benefits,                            the patient was deemed in satisfactory condition to                            undergo the procedure.  After obtaining informed consent, the colonoscope                            was passed under direct vision. Throughout the                            procedure, the patient's blood pressure, pulse, and                            oxygen saturations were monitored continuously. The                            CF [id number] #[id number] was introduced through the anus                            and advanced  to the the terminal ileum. The                            colonoscopy was performed without difficulty. The                            patient tolerated the procedure well. The quality                            of the bowel preparation was good. The terminal                            ileum, ileocecal valve, appendiceal orifice, and                            rectum were photographed. Scope In: 9:50:32 AM Scope Out: 10:04:28 AM Scope Withdrawal Time: 0 hours 11 minutes 47 seconds  Total Procedure Duration: 0 hours 13 minutes 56 seconds  Findings:                 The terminal ileum appeared normal.                           Three sessile polyps were found in the transverse                            colon and ascending colon. The polyps were 3 to 6                            mm in size. These polyps were removed with a cold                            snare. Resection and retrieval were complete.                           Multiple diverticula were found in the sigmoid                            colon, descending colon and transverse colon.  Non-bleeding internal hemorrhoids were found during                            retroflexion.                           No additional abnormalities were found on                            retroflexion. Complications:            No immediate complications. Estimated Blood Loss:     Estimated blood loss was minimal. Impression:               - The examined portion of the ileum was normal.                           - Three 3 to 6 mm polyps in the transverse colon                            and in the ascending colon, removed with a cold                            snare. Resected and retrieved.                           - Diverticulosis in the sigmoid colon, in the                            descending colon and in the transverse colon.                           - Non-bleeding internal hemorrhoids. Recommendation:           -  Discharge patient to home (with escort).                           - Await pathology results.                           - The findings and recommendations were discussed                            with the patient. Dr Particia Lather "[name]" Leonides Schanz,  06/20/2023 10:10:34 AM

## 2023-06-20 NOTE — Progress Notes (Signed)
Called to room to assist during endoscopic procedure.  Patient ID and intended procedure confirmed with present staff. Received instructions for my participation in the procedure from the performing physician.  

## 2023-06-20 NOTE — Progress Notes (Signed)
GASTROENTEROLOGY PROCEDURE H&P NOTE   Primary Care Physician: Valerie Roys, FNP    Reason for Procedure:   GERD, colon cancer screening  Plan:    EGD/colonoscopy  Patient is appropriate for endoscopic procedure(s) in the ambulatory ([facility]) setting.  The nature of the procedure, as well as the risks, benefits, and alternatives were carefully and thoroughly reviewed with the patient. Ample time for discussion and questions allowed. The patient understood, was satisfied, and agreed to proceed.     HPI: Desiree Mckinney is a 62 y.o. female who presents for EGD/colonoscopy for evaluation of GERD and colon cancer screening .  Patient was most recently seen in the Gastroenterology Clinic on 04/14/23.  No interval change in medical history since that appointment. Please refer to that note for full details regarding GI history and clinical presentation.   Past Medical History:  Diagnosis Date   Allergy    Anxiety    Arthritis    Costochondritis    Diabetes (HCC)    GERD (gastroesophageal reflux disease)    Hyperlipidemia    Hypertension    MMT (medial meniscus tear)    left   Seasonal allergies    Sleep apnea    does not use CPAP or oral appliance    Past Surgical History:  Procedure Laterality Date   ceasarean section  1993   CHONDROPLASTY Right 07/16/2018   Procedure: CHONDROPLASTY;  Surgeon: Bjorn Pippin, MD;  Location: [facility];  Service: Orthopedics;  Laterality: Right;   KNEE ARTHROSCOPY WITH MEDIAL MENISECTOMY Right 07/16/2018   Procedure: RIGHT ARTHROSCOPY KNEE CHONDROPLASTY, MEDIALMENISCECTOMY;  Surgeon: Bjorn Pippin, MD;  Location: [facility];  Service: Orthopedics;  Laterality: Right;   KNEE ARTHROSCOPY WITH MEDIAL MENISECTOMY Left 07/05/2021   Procedure: KNEE ARTHROSCOPY WITH MEDIAL MENISECTOMY;  Surgeon: Bjorn Pippin, MD;  Location: [facility];  Service: Orthopedics;  Laterality: Left;   SPLENECTOMY   childhood    Prior to Admission medications   Medication Sig Start Date End Date Taking? Authorizing Provider  atenolol (TENORMIN) 25 MG tablet Take 25 mg by mouth every evening.    [provider]  clonazePAM (KLONOPIN) 0.5 MG tablet Take 0.5 mg by mouth 2 (two) times daily as needed for anxiety.    [provider]  fexofenadine (ALLEGRA) 60 MG tablet Take 60 mg by mouth daily.    [provider]  ibuprofen (ADVIL) 200 MG tablet Take 200 mg by mouth every 6 (six) hours as needed.    [provider]  ipratropium (ATROVENT) 0.03 % nasal spray Place 2 sprays into both nostrils every 12 (twelve) hours. 05/02/23   Viviano Simas, FNP  lisinopril (ZESTRIL) 10 MG tablet Take 1 tablet (10 mg total) by mouth daily. 04/29/21   Kathlen Mody, MD  metFORMIN (GLUCOPHAGE-XR) 500 MG 24 hr tablet Take 500 mg by mouth every evening. 04/10/21   [provider]  omeprazole (PRILOSEC) 20 MG capsule Take 1 capsule (20 mg total) by mouth 2 (two) times daily before a meal. 04/14/23   Lemmon, Violet Baldy, PA  pravastatin (PRAVACHOL) 20 MG tablet Take 20 mg by mouth every evening.    [provider]  traMADol (ULTRAM) 50 MG tablet Take 50 mg by mouth 2 (two) times daily as needed for moderate pain. 03/21/22   [provider]    Current Outpatient Medications  Medication Sig Dispense Refill   atenolol (TENORMIN) 25 MG tablet Take 25 mg by mouth  every evening.     clonazePAM (KLONOPIN) 0.5 MG tablet Take 0.5 mg by mouth 2 (two) times daily as needed for anxiety.     fexofenadine (ALLEGRA) 60 MG tablet Take 60 mg by mouth daily.     ibuprofen (ADVIL) 200 MG tablet Take 200 mg by mouth every 6 (six) hours as needed.     ipratropium (ATROVENT) 0.03 % nasal spray Place 2 sprays into both nostrils every 12 (twelve) hours. 30 mL 12   lisinopril (ZESTRIL) 10 MG tablet Take 1 tablet (10 mg total) by mouth daily. 30 tablet 1   metFORMIN (GLUCOPHAGE-XR) 500 MG 24 hr  tablet Take 500 mg by mouth every evening.     omeprazole (PRILOSEC) 20 MG capsule Take 1 capsule (20 mg total) by mouth 2 (two) times daily before a meal. 60 capsule 5   pravastatin (PRAVACHOL) 20 MG tablet Take 20 mg by mouth every evening.     traMADol (ULTRAM) 50 MG tablet Take 50 mg by mouth 2 (two) times daily as needed for moderate pain.     Current Facility-Administered Medications  Medication Dose Route Frequency Provider Last Rate Last Admin   0.9 %  sodium chloride infusion  500 mL Intravenous Once Imogene Burn, MD        Allergies as of 06/20/2023 - Review Complete 06/20/2023  Allergen Reaction Noted   Buspar [buspirone] Other (See Comments) 04/10/2022   Effexor [venlafaxine] Other (See Comments) 04/10/2022   Metformin and related Diarrhea 04/10/2022   Aspirin Palpitations 05/18/2015    Family History  Problem Relation Age of Onset   Colon polyps Father    Diabetes Father    Heart failure Father    Hypertension Father    Hyperlipidemia Father    Colon polyps Brother    Colon cancer Neg Hx    Esophageal cancer Neg Hx    Rectal cancer Neg Hx    Stomach cancer Neg Hx     Social History   Socioeconomic History   Marital status: Widowed    Spouse name: Not on file   Number of children: Not on file   Years of education: Not on file   Highest education level: Not on file  Occupational History   Not on file  Tobacco Use   Smoking status: Former    Current packs/day: 0.00    Types: Cigarettes    Quit date: 09/16/1984    Years since quitting: 38.7   Smokeless tobacco: Never  Vaping Use   Vaping status: Never Used  Substance and Sexual Activity   Alcohol use: Not Currently   Drug use: Never   Sexual activity: Not on file  Other Topics Concern   Not on file  Social History Narrative   Not on file   Social Determinants of Health   Financial Resource Strain: Not on File (01/03/2022)   Received from [employer], [employer]    Financial  Resource Strain: 0  Food Insecurity: Not on File (06/12/2023)   Received from [facility] Insecurity    Food: 0  Transportation Needs: Not on File (01/03/2022)   Received from [facility] [employer] Needs    Transportation: 0  Physical Activity: Not on File (01/03/2022)   Received from [facility], [place]   Physical Activity    Physical Activity: 0  Stress: Not on File (01/03/2022)   Received from [facility], [place]   Stress    Stress: 0  Social Connections: Not  on File (06/06/2023)   Received from [facility]   Social Connections    Connectedness: 0  Intimate Partner Violence: Unknown (12/21/2021)   Received from [facility], [facility]   HITS    Physically Hurt: Not on file    Insult or Talk Down To: Not on file    Threaten Physical Harm: Not on file    Scream or Curse: Not on file    Physical Exam: Vital signs in last 24 hours: BP 126/63   Pulse 78   Temp 97.7 F (36.5 C) (Temporal)   Ht 5\' 5"  (1.651 m)   Wt 214 lb (97.1 kg)   LMP 05/17/2012 (Approximate)   SpO2 98%   BMI 35.61 kg/m  GEN: NAD EYE: Sclerae anicteric ENT: MMM CV: Non-tachycardic Pulm: No increased WOB GI: Soft NEURO:  Alert & Oriented   Eulah Pont, MD [facility]   06/20/2023 9:27 AM

## 2023-06-20 NOTE — Patient Instructions (Signed)
Discharge instructions given. Handouts on polyps,Diverticulosis and Hemorrhoids. Prescription sent to pharmacy. Office visit scheduled. Resume previous medications. YOU HAD AN ENDOSCOPIC PROCEDURE TODAY AT THE [facility]:   Refer to the procedure report that was given to you for any specific questions about what was found during the examination.  If the procedure report does not answer your questions, please call your gastroenterologist to clarify.  If you requested that your care partner not be given the details of your procedure findings, then the procedure report has been included in a sealed envelope for you to review at your convenience later.  YOU SHOULD EXPECT: Some feelings of bloating in the abdomen. Passage of more gas than usual.  Walking can help get rid of the air that was put into your GI tract during the procedure and reduce the bloating. If you had a lower endoscopy (such as a colonoscopy or flexible sigmoidoscopy) you may notice spotting of blood in your stool or on the toilet paper. If you underwent a bowel prep for your procedure, you may not have a normal bowel movement for a few days.  Please Note:  You might notice some irritation and congestion in your nose or some drainage.  This is from the oxygen used during your procedure.  There is no need for concern and it should clear up in a day or so.  SYMPTOMS TO REPORT IMMEDIATELY:  Following lower endoscopy (colonoscopy or flexible sigmoidoscopy):  Excessive amounts of blood in the stool  Significant tenderness or worsening of abdominal pains  Swelling of the abdomen that is new, acute  Fever of 100F or higher  Following upper endoscopy (EGD)  Vomiting of blood or coffee ground material  New chest pain or pain under the shoulder blades  Painful or persistently difficult swallowing  New shortness of breath  Fever of 100F or higher  Black, tarry-looking stools  For urgent or emergent issues, a  gastroenterologist can be reached at any hour by calling [phone]. Do not use MyChart messaging for urgent concerns.    DIET:  We do recommend a small meal at first, but then you may proceed to your regular diet.  Drink plenty of fluids but you should avoid alcoholic beverages for 24 hours.  ACTIVITY:  You should plan to take it easy for the rest of today and you should NOT DRIVE or use heavy machinery until tomorrow (because of the sedation medicines used during the test).    FOLLOW UP: Our staff will call the number listed on your records the next business day following your procedure.  We will call around 7:15- 8:00 am to check on you and address any questions or concerns that you may have regarding the information given to you following your procedure. If we do not reach you, we will leave a message.     If any biopsies were taken you will be contacted by phone or by letter within the next 1-3 weeks.  Please call us at [phone] if you have not heard about the biopsies in 3 weeks.    SIGNATURES/CONFIDENTIALITY: You and/or your care partner have signed paperwork which will be entered into your electronic medical record.  These signatures attest to the fact that that the information above on your After Visit Summary has been reviewed and is understood.  Full responsibility of the confidentiality of this discharge information lies with you and/or your care-partner.i

## 2023-06-20 NOTE — Progress Notes (Signed)
Updated medical record.

## 2023-06-23 ENCOUNTER — Telehealth: Payer: Self-pay

## 2023-06-23 NOTE — Telephone Encounter (Signed)
  Follow up Call-     06/20/2023    8:36 AM  Call back number  Post procedure Call Back phone  # [phone]  Permission to leave phone message Yes     Patient questions:  Do you have a fever, pain , or abdominal swelling? No. Pain Score  0 *  Have you tolerated food without any problems? Yes.    Have you been able to return to your normal activities? Yes.    Do you have any questions about your discharge instructions: Diet   No. Medications  No. Follow up visit  No.  Do you have questions or concerns about your Care? No.  Actions: * If pain score is 4 or above: No action needed, pain <4.

## 2023-06-24 ENCOUNTER — Encounter: Payer: Self-pay | Admitting: Internal Medicine

## 2023-06-24 LAB — SURGICAL PATHOLOGY

## 2023-06-30 ENCOUNTER — Other Ambulatory Visit (HOSPITAL_COMMUNITY): Payer: Self-pay

## 2023-06-30 ENCOUNTER — Telehealth: Payer: Self-pay | Admitting: Pharmacy Technician

## 2023-06-30 NOTE — Telephone Encounter (Signed)
PA has been submitted, and telephone encounter has been created. 

## 2023-06-30 NOTE — Telephone Encounter (Signed)
Pharmacy Patient Advocate Encounter   Received notification from RX Request Messages that prior authorization for OMEPRAZOLE 40MG  is required/requested.   Insurance verification completed.   The patient is insured through [facility] .   Per test claim: PA required; PA submitted to [facility] via CoverMyMeds Key/confirmation #/EOC [facility] Status is pending

## 2023-07-01 ENCOUNTER — Other Ambulatory Visit: Payer: Self-pay | Admitting: Internal Medicine

## 2023-07-11 NOTE — Telephone Encounter (Signed)
Pharmacy Patient Advocate Encounter  Received notification from [facility] that Prior Authorization for Omeprazole 40MG  capsule has been DENIED.  Full denial letter will be uploaded to the media tab. See denial reason below.  This medication has a quantity limit of 30 tablets/capsules per 30 days. To exceed this limit you must meet the following criteria:  (1) the prescribed dose, frequency, and treatment duration are consistent with one of the following: a) FDA-approved labeling, b) nationally recognized compendia (a collection of clinical data and evidence), c) clinical practice guidelines from nationally recognized professional associates or governmental agencies, or d) peer-reviewed medical literature from a major scientific or medical publication (2) documentation the requested dose or frequency cannot be achieved using a different strength or formulation covered by the plan's quantity limits (3) documentation of one of the following: a) trial and failure for at least three months of the requested drug within the plan's quantity limit (one daily dosing), b) requirement of a higher dose due to clinical circumstances (such as variations in weight or metabolism or disease severity), or c) condition eing treated requires a higher dose than the plan's quantity limit  PA #/Case ID/Reference #: [id number]

## 2023-07-17 ENCOUNTER — Other Ambulatory Visit (HOSPITAL_COMMUNITY): Payer: Self-pay

## 2023-07-17 NOTE — Telephone Encounter (Signed)
New PA has been initiated

## 2023-07-22 ENCOUNTER — Other Ambulatory Visit (HOSPITAL_COMMUNITY): Payer: Self-pay

## 2023-07-22 ENCOUNTER — Other Ambulatory Visit: Payer: Self-pay

## 2023-07-22 MED ORDER — OMEPRAZOLE 40 MG PO CPDR: 40.0000 mg | DELAYED_RELEASE_CAPSULE | Freq: Two times a day (BID) | ORAL | 1 refills | Status: AC

## 2023-07-22 NOTE — Telephone Encounter (Signed)
Pharmacy Patient Advocate Encounter  Received notification from [facility] that Prior Authorization for Omeprazole has been APPROVED. Please see more information below.   There are two prior authorizations on file that are active.   Omeprazole 20MG  effective until 04-20-2024  Omeprazole 40MG  effective until 07-20-2024  [facility]eprazole 20MG  [facility] and next available fill date is 07-26-2023.   If want to change to Omeprazole 40MG  once daily for convenience of patient, co-pay is $3.00

## 2023-09-24 ENCOUNTER — Telehealth: Payer: Self-pay | Admitting: Physician Assistant

## 2023-09-24 DIAGNOSIS — J069 Acute upper respiratory infection, unspecified: Secondary | ICD-10-CM

## 2023-09-24 MED ORDER — BENZONATATE 100 MG PO CAPS: 100.0000 mg | ORAL_CAPSULE | Freq: Three times a day (TID) | ORAL | 0 refills | Status: DC | PRN

## 2023-09-24 NOTE — Progress Notes (Signed)
 Message sent to patient requesting further input regarding current symptoms. Awaiting patient response.

## 2023-09-24 NOTE — Progress Notes (Signed)

## 2023-09-24 NOTE — Progress Notes (Signed)
 I have spent 5 minutes in review of e-visit questionnaire, review and updating patient chart, medical decision making and response to patient.   Piedad Climes, PA-C

## 2023-09-26 ENCOUNTER — Ambulatory Visit: Payer: 59 | Admitting: Internal Medicine

## 2023-12-27 ENCOUNTER — Ambulatory Visit
Admission: EM | Admit: 2023-12-27 | Discharge: 2023-12-27 | Disposition: A | Discharge status: Home / Self Care | Attending: Nurse Practitioner | Admitting: Nurse Practitioner

## 2023-12-27 DIAGNOSIS — M25531 Pain in right wrist: Secondary | ICD-10-CM | POA: Diagnosis not present

## 2023-12-27 MED ORDER — DEXAMETHASONE SODIUM PHOSPHATE 10 MG/ML IJ SOLN
10.0000 mg | Freq: Once | INTRAMUSCULAR | Status: AC
  Administered 2023-12-27: 10 mg via INTRAMUSCULAR

## 2023-12-27 MED ORDER — METHYLPREDNISOLONE 4 MG PO TBPK: ORAL_TABLET | ORAL | 0 refills | Status: DC

## 2023-12-27 MED ORDER — KETOROLAC TROMETHAMINE 60 MG/2ML IM SOLN
60.0000 mg | Freq: Once | INTRAMUSCULAR | Status: AC
  Administered 2023-12-27: 60 mg via INTRAMUSCULAR

## 2023-12-27 NOTE — ED Triage Notes (Signed)
 Pt states right arm pain for the past 2 weeks.States it started after she lifted a pot of water.  Has been using heat on it at home with some relief.

## 2023-12-27 NOTE — Discharge Instructions (Addendum)
 You have been seen today for pain in the joint between your forearm and upper arm. Joint pain can involve the bones, muscles, and surrounding tissues, and it can occur in many areas of the body. The exact cause of your pain is not clear, but your exam does not show signs of infection or nerve damage. You were given an injection of a medication called Decadron, which is a steroid that helps reduce inflammation and pain, as well as a medication called Toradol, which also helps with pain and swelling. You have been prescribed a steroid medication to start tomorrow morning and continue until it is finished. You should continue to apply warm, moist heat to the area several times a day. If your symptoms do not improve, follow up with orthopedics.

## 2023-12-27 NOTE — ED Provider Notes (Signed)
 [facility]    CSN: [id number] Arrival date & time: 12/27/23  1034      History   Chief Complaint Chief Complaint  Patient presents with   Arm Pain    HPI Desiree Mckinney is a 63 y.o. female.   Desiree Mckinney is a 63 year old female presenting with pain in the right antecubital fossa that has been ongoing for the past two weeks. The pain began after lifting a heavy pot of water. She describes the pain as constant, rated 6 out of 10 at baseline, and increasing to 8-9 out of 10 with any arm movement. She denies any radiation of the pain, numbness, or tingling.  No shoulder pain, elbow pain, redness, swelling or recent venipuncture to the area.  The pain is severe enough to occasionally wake her from sleep. She has tried ice, which worsens the pain, and reports no relief from ibuprofen or tramadol. However, applying heat provides some improvement in her symptoms.   The following portions of the patient's history were reviewed and updated as appropriate: allergies, current medications, past family history, past medical history, past social history, past surgical history, and problem list.      Past Medical History:  Diagnosis Date   Allergy    Anxiety    Arthritis    Costochondritis    Diabetes (HCC)    GERD (gastroesophageal reflux disease)    Hyperlipidemia    Hypertension    MMT (medial meniscus tear)    left   Seasonal allergies    Sleep apnea    does not use CPAP or oral appliance    Patient Active Problem List   Diagnosis Date Noted   LBBB (left bundle branch block) 06/19/2022   Mixed hyperlipidemia 06/19/2022   Aortic atherosclerosis (HCC) 06/19/2022   Elevated troponin 04/27/2021   Essential hypertension 04/27/2021   Type 2 diabetes mellitus with obesity (HCC) 04/27/2021   Post traumatic stress disorder (PTSD) 11/08/2019   Bruxism (teeth grinding) 11/08/2019   Arthropathy of both temporomandibular joints 11/08/2019   Loud snoring  11/08/2019   Morbid obesity (HCC) 11/08/2019   Psychophysiological insomnia 11/08/2019    Past Surgical History:  Procedure Laterality Date   ceasarean section  1993   CHONDROPLASTY Right 07/16/2018   Procedure: CHONDROPLASTY;  Surgeon: Bjorn Pippin, MD;  Location: [facility];  Service: Orthopedics;  Laterality: Right;   KNEE ARTHROSCOPY WITH MEDIAL MENISECTOMY Right 07/16/2018   Procedure: RIGHT ARTHROSCOPY KNEE CHONDROPLASTY, MEDIALMENISCECTOMY;  Surgeon: Bjorn Pippin, MD;  Location: [facility];  Service: Orthopedics;  Laterality: Right;   KNEE ARTHROSCOPY WITH MEDIAL MENISECTOMY Left 07/05/2021   Procedure: KNEE ARTHROSCOPY WITH MEDIAL MENISECTOMY;  Surgeon: Bjorn Pippin, MD;  Location:  SURGERY CENTER;  Service: Orthopedics;  Laterality: Left;   SPLENECTOMY  childhood    OB History   No obstetric history on file.      Home Medications    Prior to Admission medications   Medication Sig Start Date End Date Taking? Authorizing Provider  methylPREDNISolone (MEDROL DOSEPAK) 4 MG TBPK tablet Take as directed. Start in AM of 12/28/23 12/27/23  Yes Lurline Idol, FNP  omeprazole (PRILOSEC) 40 MG capsule Take 1 capsule (40 mg total) by mouth 2 (two) times daily before a meal. 07/22/23   Imogene Burn, MD  atenolol (TENORMIN) 25 MG tablet Take 25 mg by mouth every evening.    [provider]  benzonatate (TESSALON) 100 MG capsule Take 1 capsule (  100 mg total) by mouth 3 (three) times daily as needed for cough. 09/24/23   Farris Hong, PA-C  clonazePAM (KLONOPIN) 0.5 MG tablet Take 0.5 mg by mouth 2 (two) times daily as needed for anxiety.    [provider]  fexofenadine (ALLEGRA) 60 MG tablet Take 60 mg by mouth daily.    [provider]  ibuprofen (ADVIL) 200 MG tablet Take 200 mg by mouth every 6 (six) hours as needed.    [provider]  ipratropium (ATROVENT) 0.03 % nasal spray Place 2 sprays into  both nostrils every 12 (twelve) hours. 05/02/23   Mardene Shake, FNP  lisinopril (ZESTRIL) 10 MG tablet Take 1 tablet (10 mg total) by mouth daily. 04/29/21   Akula, Vijaya, MD  metFORMIN (GLUCOPHAGE-XR) 500 MG 24 hr tablet Take 500 mg by mouth every evening. 04/10/21   [provider]  pravastatin (PRAVACHOL) 20 MG tablet Take 20 mg by mouth every evening.    [provider]  traMADol (ULTRAM) 50 MG tablet Take 50 mg by mouth 2 (two) times daily as needed for moderate pain. 03/21/22   [provider]    Family History Family History  Problem Relation Age of Onset   Colon polyps Father    Diabetes Father    Heart failure Father    Hypertension Father    Hyperlipidemia Father    Colon polyps Brother    Colon cancer Neg Hx    Esophageal cancer Neg Hx    Rectal cancer Neg Hx    Stomach cancer Neg Hx     Social History Social History   Tobacco Use   Smoking status: Former    Current packs/day: 0.00    Types: Cigarettes    Quit date: 09/16/1984    Years since quitting: 39.3   Smokeless tobacco: Never  Vaping Use   Vaping status: Never Used  Substance Use Topics   Alcohol use: Not Currently   Drug use: Never     Allergies   Buspirone, Other, Venlafaxine, Metformin and related, and Aspirin   Review of Systems Review of Systems  Musculoskeletal:  Positive for arthralgias and myalgias. Negative for joint swelling.  Skin:  Negative for rash and wound.  Neurological:  Negative for weakness and numbness.  All other systems reviewed and are negative.    Physical Exam Triage Vital Signs ED Triage Vitals [12/27/23 1055]  Encounter Vitals Group     BP 137/80     Systolic BP Percentile      Diastolic BP Percentile      Pulse Rate 92     Resp 16     Temp 98.1 F (36.7 C)     Temp Source Oral     SpO2 97 %     Weight      Height      Head Circumference      Peak Flow      Pain Score 7     Pain Loc      Pain Education      Exclude from Growth  Chart    No data found.  Updated Vital Signs BP 137/80 (BP Location: Left Arm)   Pulse 92   Temp 98.1 F (36.7 C) (Oral)   Resp 16   LMP 05/17/2012 (Approximate)   SpO2 97%   Visual Acuity Right Eye Distance:   Left Eye Distance:   Bilateral Distance:    Right Eye Near:   Left Eye Near:  Bilateral Near:     Physical Exam Vitals reviewed.  Constitutional:      General: She is not in acute distress.    Appearance: Normal appearance. She is not ill-appearing or toxic-appearing.  HENT:     Head: Normocephalic.  Cardiovascular:     Rate and Rhythm: Normal rate.  Pulmonary:     Effort: Pulmonary effort is normal.  Musculoskeletal:        General: Normal range of motion.     Right shoulder: Normal.     Right upper arm: Normal.     Right elbow: Normal.     Right forearm: Tenderness present. No swelling, edema, deformity, lacerations or bony tenderness.     Right wrist: Normal.     Right hand: Normal.     Cervical back: Normal range of motion and neck supple.     Comments: Right-hand dominant   Skin:    General: Skin is warm and dry.  Neurological:     General: No focal deficit present.     Mental Status: She is alert and oriented to person, place, and time.     Cranial Nerves: No cranial nerve deficit.     Sensory: Sensation is intact. No sensory deficit.     Motor: Motor function is intact. No weakness.      UC Treatments / Results  Labs (all labs ordered are listed, but only abnormal results are displayed) Labs Reviewed - No data to display  EKG   Radiology No results found.  Procedures Procedures (including critical care time)  Medications Ordered in UC Medications  ketorolac (TORADOL) injection 60 mg (60 mg Intramuscular Given 12/27/23 1326)  dexamethasone (DECADRON) injection 10 mg (10 mg Intramuscular Given 12/27/23 1327)    Initial Impression / Assessment and Plan / UC Course  I have reviewed the triage vital signs and the nursing  notes.  Pertinent labs & imaging results that were available during my care of the patient were reviewed by me and considered in my medical decision making (see chart for details).    63 year old female presenting with right antecubital fossa pain ongoing for the past 2 weeks following lifting a heavy pot of water. The pain is constant and worsens with arm movement. She denies radiation, paresthesias, or involvement of the shoulder, arm, or elbow. No redness, swelling, or history of recent venipuncture. Physical exam reveals tenderness in the area without focal deficits or abnormalities of the right upper extremity. Clinical findings do not suggest a biceps or elbow origin, and there is no indication of nerve involvement or specific injury. Symptoms are most consistent with joint inflammation. Toradol and Decadron were administered in clinic. A short course of steroids was prescribed. Patient advised to continue using heat for symptom relief and to follow up with orthopedics if symptoms do not improve.  Today's evaluation has revealed no signs of a dangerous process. Discussed diagnosis with patient and/or guardian. Patient and/or guardian aware of their diagnosis, possible red flag symptoms to watch out for and need for close follow up. Patient and/or guardian understands verbal and written discharge instructions. Patient and/or guardian comfortable with plan and disposition.  Patient and/or guardian has a clear mental status at this time, good insight into illness (after discussion and teaching) and has clear judgment to make decisions regarding their care  Documentation was completed with the aid of voice recognition software. Transcription may contain typographical errors. Final Clinical Impressions(s) / UC Diagnoses   Final diagnoses:  Arthralgia of right forearm  Discharge Instructions      You have been seen today for pain in the joint between your forearm and upper arm. Joint pain can  involve the bones, muscles, and surrounding tissues, and it can occur in many areas of the body. The exact cause of your pain is not clear, but your exam does not show signs of infection or nerve damage. You were given an injection of a medication called Decadron, which is a steroid that helps reduce inflammation and pain, as well as a medication called Toradol, which also helps with pain and swelling. You have been prescribed a steroid medication to start tomorrow morning and continue until it is finished. You should continue to apply warm, moist heat to the area several times a day. If your symptoms do not improve, follow up with orthopedics.        ED Prescriptions     Medication Sig Dispense Auth. Provider   methylPREDNISolone (MEDROL DOSEPAK) 4 MG TBPK tablet Take as directed. Start in AM of 12/28/23 21 tablet Maryruth Sol, FNP      PDMP not reviewed this encounter.   Maryruth Sol, [place] 12/27/23 1330

## 2024-02-24 ENCOUNTER — Emergency Department (HOSPITAL_COMMUNITY): Admission: EM | Admit: 2024-02-24 | Discharge: 2024-02-24 | Disposition: A | Discharge status: Home / Self Care

## 2024-02-24 ENCOUNTER — Emergency Department (HOSPITAL_COMMUNITY)

## 2024-02-24 ENCOUNTER — Other Ambulatory Visit: Payer: Self-pay

## 2024-02-24 DIAGNOSIS — Z7984 Long term (current) use of oral hypoglycemic drugs: Secondary | ICD-10-CM | POA: Insufficient documentation

## 2024-02-24 DIAGNOSIS — R002 Palpitations: Secondary | ICD-10-CM | POA: Diagnosis not present

## 2024-02-24 DIAGNOSIS — D72829 Elevated white blood cell count, unspecified: Secondary | ICD-10-CM | POA: Insufficient documentation

## 2024-02-24 DIAGNOSIS — E119 Type 2 diabetes mellitus without complications: Secondary | ICD-10-CM | POA: Insufficient documentation

## 2024-02-24 DIAGNOSIS — Z79899 Other long term (current) drug therapy: Secondary | ICD-10-CM | POA: Insufficient documentation

## 2024-02-24 DIAGNOSIS — I10 Essential (primary) hypertension: Secondary | ICD-10-CM | POA: Insufficient documentation

## 2024-02-24 LAB — TROPONIN I (HIGH SENSITIVITY): Troponin I (High Sensitivity): 4 ng/L (ref <18)

## 2024-02-24 LAB — CBC
HCT: 37.6 % (ref 36.0–46.0)
Hemoglobin: 12.3 g/dL (ref 12.0–15.0)
MCH: 30.1 pg (ref 26.0–34.0)
MCHC: 32.7 g/dL (ref 30.0–36.0)
MCV: 92.2 fL (ref 80.0–100.0)
Platelets: 298 10*3/uL (ref 150–400)
RBC: 4.08 MIL/uL (ref 3.87–5.11)
RDW: 15.1 % (ref 11.5–15.5)
WBC: 12.3 10*3/uL — ABNORMAL HIGH (ref 4.0–10.5)
nRBC: 0 % (ref 0.0–0.2)

## 2024-02-24 LAB — BASIC METABOLIC PANEL WITH GFR
Anion gap: 11 (ref 5–15)
BUN: 12 mg/dL (ref 8–23)
CO2: 24 mmol/L (ref 22–32)
Calcium: 9.4 mg/dL (ref 8.9–10.3)
Chloride: 106 mmol/L (ref 98–111)
Creatinine, Ser: 0.65 mg/dL (ref 0.44–1.00)
GFR, Estimated: >60 mL/min (ref >60)
Glucose, Bld: 148 mg/dL — ABNORMAL HIGH (ref 70–99)
Potassium: 3.8 mmol/L (ref 3.5–5.1)
Sodium: 141 mmol/L (ref 135–145)

## 2024-02-24 LAB — TSH: TSH: 1.042 u[IU]/mL (ref 0.350–4.500)

## 2024-02-24 LAB — D-DIMER, QUANTITATIVE: D-Dimer, Quant: 0.44 ug{FEU}/mL (ref 0.00–0.50)

## 2024-02-24 MED ORDER — LACTATED RINGERS IV BOLUS
1000.0000 mL | Freq: Once | INTRAVENOUS | Status: AC
  Administered 2024-02-24: 1000 mL via INTRAVENOUS

## 2024-02-24 NOTE — ED Triage Notes (Addendum)
 Pt. Stated, on Sunday I could feel my heart like fluttering for about 20 minutes. It came back yesterday for a couple times and went away. This morning it started at 0400 off and on and it still going on. Its like every 10 sec. Denies any other symptoms.

## 2024-02-24 NOTE — ED Provider Notes (Signed)
 [facility] Provider Note   CSN: [id number] Arrival date & time: 02/24/24  0845     History  Chief Complaint  Patient presents with   chest fluttering    Desiree Mckinney is a 63 y.o. female.  63 year old female with past medical history of hypertension, hyperlipidemia, and diabetes presenting to the emergency department today with palpitations and an abnormal feeling in her chest.  The patient states that this has happened a few times since Sunday.  She states that she has had episodes where she feels like there is a "gush" in her chest that happens from time to time.  She states that since she has gotten up this morning that this has been persistent.  Denies any specific chest pain with this.  She denies any shortness of breath or lightheadedness.  She states that initially this was intermittent but is now constant.  She denies a history of DVT or pulmonary embolism, recent surgeries, recent travel.        Home Medications Prior to Admission medications   Medication Sig Start Date End Date Taking? Authorizing Provider  atenolol  (TENORMIN ) 25 MG tablet Take 25 mg by mouth every evening.   Yes [provider]  clonazePAM  (KLONOPIN ) 0.5 MG tablet Take 0.5 mg by mouth 2 (two) times daily as needed for anxiety.   Yes [provider]  cyclobenzaprine (FLEXERIL) 10 MG tablet Take 10 mg by mouth at bedtime as needed for muscle spasms. 02/03/24  Yes [provider]  fexofenadine (ALLEGRA) 60 MG tablet Take 60 mg by mouth daily.   Yes [provider]  ibuprofen  (ADVIL ) 200 MG tablet Take 200 mg by mouth every 6 (six) hours as needed.   Yes [provider]  lisinopril  (ZESTRIL ) 10 MG tablet Take 1 tablet (10 mg total) by mouth daily. 04/29/21  Yes Akula, Vijaya, MD  metFORMIN (GLUCOPHAGE-XR) 500 MG 24 hr tablet Take 500 mg by mouth every evening. 04/10/21  Yes [provider]  omeprazole   (PRILOSEC) 40 MG capsule Take 1 capsule (40 mg total) by mouth 2 (two) times daily before a meal. 07/22/23  Yes Daina Drum, MD  pravastatin  (PRAVACHOL ) 20 MG tablet Take 20 mg by mouth every evening.   Yes [provider]  traMADol (ULTRAM) 50 MG tablet Take 50 mg by mouth 2 (two) times daily as needed for moderate pain. 03/21/22  Yes [provider]      Allergies    Buspirone, Venlafaxine, Aspirin , and Metformin and related    Review of Systems   Review of Systems  Cardiovascular:  Positive for palpitations.  All other systems reviewed and are negative.    Physical Exam Updated Vital Signs BP 138/71   Pulse 75   Temp 99.4 F (37.4 C)   Resp 17   Ht 5\' 5"  (1.651 m)   Wt 88.5 kg   LMP 05/17/2012 (Approximate)   SpO2 100%   BMI 32.45 kg/m  Physical Exam Vitals and nursing note reviewed.    Gen: NAD Eyes: PERRL, EOMI HEENT: no oropharyngeal swelling Neck: trachea midline Resp: clear to auscultation bilaterally Card: Tachycardic, no murmurs, rubs, or gallops Abd: nontender, nondistended Extremities: no calf tenderness, no edema Vascular: 2+ radial pulses bilaterally, 2+ DP pulses bilaterally Neuro: No focal deficits Skin: no rashes Psyc: acting appropriately ED Results / Procedures / Treatments   Labs (all labs ordered are listed, but only abnormal results are displayed) Labs Reviewed  BASIC  METABOLIC PANEL WITH GFR - Abnormal; Notable for the following components:      Result Value   Glucose, Bld 148 (*)    All other components within normal limits  CBC - Abnormal; Notable for the following components:   WBC 12.3 (*)    All other components within normal limits  TSH  D-DIMER, QUANTITATIVE  TROPONIN I (HIGH SENSITIVITY)    EKG EKG Interpretation Date/Time:  Tuesday February 24 2024 08:50:27 EDT Ventricular Rate:  113 PR Interval:  142 QRS Duration:  144 QT Interval:  372 QTC Calculation: 510 R Axis:   59  Text Interpretation: Sinus  tachycardia Left bundle branch block Abnormal ECG When compared with ECG of 10-Apr-2022 00:49, PREVIOUS ECG IS PRESENT Confirmed by Abner Hoffman [phone]) on 02/24/2024 9:17:28 AM  Radiology DG Chest 2 View Result Date: 02/24/2024 CLINICAL DATA:  Fluttering in chest. EXAM: CHEST - 2 VIEW COMPARISON:  Chest radiograph dated 04/10/2022. FINDINGS: The heart size and mediastinal contours are within normal limits. Both lungs are clear. The visualized skeletal structures are unremarkable. IMPRESSION: No active cardiopulmonary disease. Electronically Signed   By: Angus Bark M.D.   On: 02/24/2024 09:57    Procedures Procedures    Medications Ordered in ED Medications  lactated ringers  bolus 1,000 mL (0 mLs Intravenous Stopped 02/24/24 1143)    ED Course/ Medical Decision Making/ A&P                                 Medical Decision Making 63 year old female with past medical history of diabetes, hypertension, and hyperlipidemia presenting to the emergency department today with palpitations.  I will further evaluate the patient here with basic labs to evaluate for anemia or electrolyte abnormalities in addition to an EKG and troponin to eval for ACS.  Will obtain a TSH and D-dimer as the patient is tachycardic here on arrival.  EKG does show a sinus tachycardia with left bundle branch block.  She does have a history of this looking back through her chart.  Will give her IV fluids here and keep her on the cardiac monitor to evaluate for arrhythmias.  I will reevaluate for ultimate disposition.  The patient's labs here are reassuring.  D-dimer is negative.  TSH is unremarkable.  Labs are reassuring.  She is feeling better on reassessment.  She is discharged with cardiology follow-up with return precautions.  Amount and/or Complexity of Data Reviewed Labs: ordered. Radiology: ordered.           Final Clinical Impression(s) / ED Diagnoses Final diagnoses:  Palpitations    Rx / DC  Orders ED Discharge Orders          Ordered    Ambulatory referral to Cardiology       Comments: If you have not heard from the Cardiology office within the next 72 hours please call [phone].   02/24/24 1145              Carin Charleston, MD 02/24/24 1146

## 2024-02-24 NOTE — Discharge Instructions (Signed)
 Your workup today was reassuring.  I have placed a consult to cardiology.  You should receive a call in the next few days regarding follow-up.  Please follow-up and return to the emergency department for worsening symptoms.

## 2024-06-01 NOTE — Progress Notes (Deleted)
 Cardiology Office Note:  .   Date:  06/01/2024  ID:  Desiree Mckinney, DOB [birth year], MRN [id number] PCP: Delores Delorise Lunger, FNP  [facility] Providers Cardiologist:  Stanly DELENA Leavens, MD Cardiology APP:  Lelon Glendia DASEN, PA-C { Click to update primary MD,subspecialty MD or APP then REFRESH:1}   History of Present Illness: .   Desiree Mckinney is a 63 y.o. female  with a hx of HTN with DM, HLD and aortic atherosclerosis, intermittent LBBB whow as seen for CP in 2022.  Had  SVT with aberrancy during stress testing in 2022. Has MSK pain and was seen 05/2021 with no issues.  2023: LBBB.    Was in ED 02/2024 with palpitations and EKG Sinus tachy with LBBB. She was given IV fluids. Labs reassuring.  ROS: ***  Studies Reviewed: SABRA         Prior CV Studies: {Select studies to display:26339}   ECHO COMPLETE WO IMAGING ENHANCING AGENT 04/27/2021   Narrative ECHOCARDIOGRAM REPORT       Patient Name:   Desiree Mckinney Date of Exam: 04/27/2021 Medical Rec #:  [id number]          Height:       65.0 in Accession #:    [id number]         Weight:       215.0 lb Date of Birth:  [birth year]          BSA:          2.040 m Patient Age:    59 years           BP:           99/73 mmHg Patient Gender: F                  HR:           96 bpm. Exam Location:  Inpatient   Procedure: 2D Echo, Cardiac Doppler and Color Doppler   Indications:    R07.9* Chest pain, unspecified   History:        Patient has no prior history of Echocardiogram examinations. Risk Factors:Hypertension, Diabetes, Dyslipidemia, Sleep Apnea and GERD.   Sonographer:    Tiffany Dance RVT Referring Phys: [id number] STEPHANIE FALK MARTIN   IMPRESSIONS     1. Left ventricular ejection fraction, by estimation, is 60 to 65%. The left ventricle has normal function. The left ventricle has no regional wall motion abnormalities. Left ventricular diastolic parameters are consistent with Grade I  diastolic dysfunction (impaired relaxation). 2. Right ventricular systolic function is normal. The right ventricular size is normal. 3. The mitral valve is normal in structure. Trivial mitral valve regurgitation. No evidence of mitral stenosis. 4. The aortic valve is normal in structure. Aortic valve regurgitation is not visualized. No aortic stenosis is present. 5. The inferior vena cava is normal in size with greater than 50% respiratory variability, suggesting right atrial pressure of 3 mmHg.   FINDINGS Left Ventricle: Left ventricular ejection fraction, by estimation, is 60 to 65%. The left ventricle has normal function. The left ventricle has no regional wall motion abnormalities. The left ventricular internal cavity size was normal in size. There is no left ventricular hypertrophy. Left ventricular diastolic parameters are consistent with Grade I diastolic dysfunction (impaired relaxation).   Right Ventricle: The right ventricular size is normal. No increase in right ventricular wall thickness. Right ventricular systolic function is normal.   Left Atrium: Left atrial size was normal  in size.   Right Atrium: Right atrial size was normal in size.   Pericardium: There is no evidence of pericardial effusion.   Mitral Valve: The mitral valve is normal in structure. Trivial mitral valve regurgitation. No evidence of mitral valve stenosis.   Tricuspid Valve: The tricuspid valve is normal in structure. Tricuspid valve regurgitation is not demonstrated. No evidence of tricuspid stenosis.   Aortic Valve: The aortic valve is normal in structure. Aortic valve regurgitation is not visualized. No aortic stenosis is present.   Pulmonic Valve: The pulmonic valve was normal in structure. Pulmonic valve regurgitation is not visualized. No evidence of pulmonic stenosis.   Aorta: The aortic root is normal in size and structure.   Venous: The inferior vena cava is normal in size with greater than 50%  respiratory variability, suggesting right atrial pressure of 3 mmHg.   IAS/Shunts: No atrial level shunt detected by color flow Doppler.     LEFT VENTRICLE PLAX 2D LVIDd:         4.70 cm     Diastology LVIDs:         3.10 cm     LV e' medial:    8.16 cm/s LV PW:         1.00 cm     LV E/e' medial:  10.4 LV IVS:        0.80 cm     LV e' lateral:   9.14 cm/s LV E/e' lateral: 9.3   LV Volumes (MOD) LV vol d, MOD A2C: 64.2 ml LV vol d, MOD A4C: 70.5 ml LV vol s, MOD A2C: 20.1 ml LV vol s, MOD A4C: 23.5 ml LV SV MOD A2C:     44.1 ml LV SV MOD A4C:     70.5 ml LV SV MOD BP:      45.6 ml   RIGHT VENTRICLE             IVC RV Basal diam:  2.90 cm     IVC diam: 1.20 cm RV S prime:     13.40 cm/s TAPSE (M-mode): 1.8 cm   LEFT ATRIUM             Index       RIGHT ATRIUM          Index LA diam:        3.90 cm 1.91 cm/m  RA Area:     9.45 cm LA Vol (A2C):   60.4 ml 29.61 ml/m RA Volume:   16.70 ml 8.19 ml/m LA Vol (A4C):   37.2 ml 18.24 ml/m LA Biplane Vol: 48.9 ml 23.97 ml/m AORTIC VALVE LVOT Vmax:   100.20 cm/s LVOT Vmean:  66.300 cm/s LVOT VTI:    0.206 m   AORTA Ao Asc diam: 2.70 cm   MITRAL VALVE MV Area (PHT): 3.54 cm    SHUNTS MV Decel Time: 214 msec    Systemic VTI: 0.21 m MV E velocity: 84.60 cm/s MV A velocity: 77.10 cm/s MV E/A ratio:  1.10   Oneil Parchment MD Electronically signed by Oneil Parchment MD Signature Date/Time: 04/27/2021/12:26:55 PM       Final       Risk Assessment/Calculations:   {Does this patient have ATRIAL FIBRILLATION?:[phone]} No BP recorded.  {Refresh Note OR Click here to enter BP  :1}***       Physical Exam:   VS:  LMP 05/17/2012 (Approximate)    Orhtostatics: No data found. Wt Readings from Last 3 Encounters:  02/24/24 195 lb (88.5 kg)  06/20/23 214 lb (97.1 kg)  04/14/23 214 lb (97.1 kg)    GEN: Well nourished, well developed in no acute distress NECK: No JVD; No carotid bruits CARDIAC: ***RRR, no murmurs, rubs,  gallops RESPIRATORY:  Clear to auscultation without rales, wheezing or rhonchi  ABDOMEN: Soft, non-tender, non-distended EXTREMITIES:  No edema; No deformity   ASSESSMENT AND PLAN: .    HTN with DM - controlled on current therapy   Aortic atherosclerosis HLD - LDL goal < 70, she has repeat labs on 06/27/22, may increase statin to 20 mg - reviewed old CTPE with patient   P-SVT with LBBB - discussed LBBB and DDX - no symptoms no CP - will not plans new ischemic work up        {Are you ordering a CV Procedure (e.g. stress test, cath, DCCV, TEE, etc)?   Press F2        :[id number]}  Dispo: ***  Signed, Olivia Pavy, PA-C

## 2024-06-08 ENCOUNTER — Ambulatory Visit: Admitting: Physician Assistant

## 2024-06-28 NOTE — Progress Notes (Signed)
 Cardiology Office Note:  .   Date:  07/12/2024  ID:  Desiree Mckinney, DOB 1961-06-10, MRN [id number] PCP: Delores Delorise Lunger, FNP  [facility] Providers Cardiologist:  Stanly DELENA Leavens, MD Cardiology APP:  Lelon Glendia DASEN, PA-C    History of Present Illness: .   Desiree Mckinney is a 63 y.o. female  with a hx of HTN with DM, HLD and aortic atherosclerosis, intermittent LBBB whow as seen for CP in 2022.  Had  SVT with aberrancy during stress testing in 2022.Has MSK pain and was seen 05/2021 with no issues.  2023: LBBB.    Was in ED 02/2024 with palpitations and EKG Sinus tachy with LBBB. She was given IV fluids. Labs reassuring.  Patient here for f/u. Denies chest pain, dyspnea, dizziness, edema. She has had a lot of stress-her daughter got married a couple months ago and her father was in hospice and funeral was yesterday. She hadn't been able to exercise much recently. She had previously lost about 30 lbs and has gained about 16 back. She's going to start walking and cut out carbs.   ROS:    Studies Reviewed: SABRA         Prior CV Studies:    ECHO COMPLETE WO IMAGING ENHANCING AGENT 04/27/2021   Narrative ECHOCARDIOGRAM REPORT       Patient Name:   Desiree Mckinney Date of Exam: 04/27/2021 Medical Rec #:  [id number]          Height:       65.0 in Accession #:    [id number]         Weight:       215.0 lb Date of Birth:  [birth year]          BSA:          2.040 m Patient Age:    59 years           BP:           99/73 mmHg Patient Gender: F                  HR:           96 bpm. Exam Location:  Inpatient   Procedure: 2D Echo, Cardiac Doppler and Color Doppler   Indications:    R07.9* Chest pain, unspecified   History:        Patient has no prior history of Echocardiogram examinations. Risk Factors:Hypertension, Diabetes, Dyslipidemia, Sleep Apnea and GERD.   Sonographer:    Tiffany Dance RVT Referring Phys: [id number] STEPHANIE FALK MARTIN    IMPRESSIONS     1. Left ventricular ejection fraction, by estimation, is 60 to 65%. The left ventricle has normal function. The left ventricle has no regional wall motion abnormalities. Left ventricular diastolic parameters are consistent with Grade I diastolic dysfunction (impaired relaxation). 2. Right ventricular systolic function is normal. The right ventricular size is normal. 3. The mitral valve is normal in structure. Trivial mitral valve regurgitation. No evidence of mitral stenosis. 4. The aortic valve is normal in structure. Aortic valve regurgitation is not visualized. No aortic stenosis is present. 5. The inferior vena cava is normal in size with greater than 50% respiratory variability, suggesting right atrial pressure of 3 mmHg.   FINDINGS Left Ventricle: Left ventricular ejection fraction, by estimation, is 60 to 65%. The left ventricle has normal function. The left ventricle has no regional wall motion abnormalities. The left ventricular internal cavity size was  normal in size. There is no left ventricular hypertrophy. Left ventricular diastolic parameters are consistent with Grade I diastolic dysfunction (impaired relaxation).   Right Ventricle: The right ventricular size is normal. No increase in right ventricular wall thickness. Right ventricular systolic function is normal.   Left Atrium: Left atrial size was normal in size.   Right Atrium: Right atrial size was normal in size.   Pericardium: There is no evidence of pericardial effusion.   Mitral Valve: The mitral valve is normal in structure. Trivial mitral valve regurgitation. No evidence of mitral valve stenosis.   Tricuspid Valve: The tricuspid valve is normal in structure. Tricuspid valve regurgitation is not demonstrated. No evidence of tricuspid stenosis.   Aortic Valve: The aortic valve is normal in structure. Aortic valve regurgitation is not visualized. No aortic stenosis is present.   Pulmonic Valve: The  pulmonic valve was normal in structure. Pulmonic valve regurgitation is not visualized. No evidence of pulmonic stenosis.   Aorta: The aortic root is normal in size and structure.   Venous: The inferior vena cava is normal in size with greater than 50% respiratory variability, suggesting right atrial pressure of 3 mmHg.   IAS/Shunts: No atrial level shunt detected by color flow Doppler.     LEFT VENTRICLE PLAX 2D LVIDd:         4.70 cm     Diastology LVIDs:         3.10 cm     LV e' medial:    8.16 cm/s LV PW:         1.00 cm     LV E/e' medial:  10.4 LV IVS:        0.80 cm     LV e' lateral:   9.14 cm/s LV E/e' lateral: 9.3   LV Volumes (MOD) LV vol d, MOD A2C: 64.2 ml LV vol d, MOD A4C: 70.5 ml LV vol s, MOD A2C: 20.1 ml LV vol s, MOD A4C: 23.5 ml LV SV MOD A2C:     44.1 ml LV SV MOD A4C:     70.5 ml LV SV MOD BP:      45.6 ml   RIGHT VENTRICLE             IVC RV Basal diam:  2.90 cm     IVC diam: 1.20 cm RV S prime:     13.40 cm/s TAPSE (M-mode): 1.8 cm   LEFT ATRIUM             Index       RIGHT ATRIUM          Index LA diam:        3.90 cm 1.91 cm/m  RA Area:     9.45 cm LA Vol (A2C):   60.4 ml 29.61 ml/m RA Volume:   16.70 ml 8.19 ml/m LA Vol (A4C):   37.2 ml 18.24 ml/m LA Biplane Vol: 48.9 ml 23.97 ml/m AORTIC VALVE LVOT Vmax:   100.20 cm/s LVOT Vmean:  66.300 cm/s LVOT VTI:    0.206 m   AORTA Ao Asc diam: 2.70 cm   MITRAL VALVE MV Area (PHT): 3.54 cm    SHUNTS MV Decel Time: 214 msec    Systemic VTI: 0.21 m MV E velocity: 84.60 cm/s MV A velocity: 77.10 cm/s MV E/A ratio:  1.10   Oneil Parchment MD Electronically signed by Oneil Parchment MD Signature Date/Time: 04/27/2021/12:26:55 PM       Final  Risk Assessment/Calculations:             Physical Exam:   VS:  BP 121/73 (BP Location: Left Arm, Patient Position: Sitting, Cuff Size: Normal)   Pulse 72   Ht 5' 5 (1.651 m)   Wt 206 lb 12.8 oz (93.8 kg)   LMP 05/17/2012 (Approximate)    SpO2 96%   BMI 34.41 kg/m    Orhtostatics: No data found. Wt Readings from Last 3 Encounters:  07/12/24 206 lb 12.8 oz (93.8 kg)  02/24/24 195 lb (88.5 kg)  06/20/23 214 lb (97.1 kg)    GEN: Well nourished, well developed in no acute distress NECK: No JVD; No carotid bruits CARDIAC:  RRR, S4, no murmurs, rubs  RESPIRATORY:  Clear to auscultation without rales, wheezing or rhonchi  ABDOMEN: Soft, non-tender, non-distended EXTREMITIES:  No edema; No deformity   ASSESSMENT AND PLAN: .    HTN with DM - controlled on atenolol , lisinopril     Aortic atherosclerosis HLD - LDL goal < 55, LDL 147 06/2023 on pravachol  20 mg daily. Check FLP today    PSVT with LBBB - no symptoms no CP - will not plans new ischemic work up  Obesity-exercise and weight loss discussed.           Dispo: f/u in 1 yr Dr. Santo  Signed, Olivia Pavy, PA-C

## 2024-07-12 ENCOUNTER — Ambulatory Visit: Attending: Physician Assistant | Admitting: Physician Assistant

## 2024-07-12 ENCOUNTER — Encounter: Payer: Self-pay | Admitting: Physician Assistant

## 2024-07-12 VITALS — BP 121/73 | HR 72 | Ht 65.0 in | Wt 206.8 lb

## 2024-07-12 DIAGNOSIS — I447 Left bundle-branch block, unspecified: Secondary | ICD-10-CM

## 2024-07-12 DIAGNOSIS — I10 Essential (primary) hypertension: Secondary | ICD-10-CM

## 2024-07-12 DIAGNOSIS — I700 Atherosclerosis of aorta: Secondary | ICD-10-CM | POA: Diagnosis not present

## 2024-07-12 DIAGNOSIS — E66811 Obesity, class 1: Secondary | ICD-10-CM

## 2024-07-12 NOTE — Patient Instructions (Signed)
 Medication Instructions:  Your physician recommends that you continue on your current medications as directed. Please refer to the Current Medication list given to you today.  *If you need a refill on your cardiac medications before your next appointment, please call your pharmacy*  Lab Work: TODAY:  LIPID  If you have labs (blood work) drawn today and your tests are completely normal, you will receive your results only by: MyChart Message (if you have MyChart) OR A paper copy in the mail If you have any lab test that is abnormal or we need to change your treatment, we will call you to review the results.  Testing/Procedures: None ordered  Follow-Up: At [facility], you and your health needs are our priority.  As part of our continuing mission to provide you with exceptional heart care, our providers are all part of one team.  This team includes your primary Cardiologist (physician) and Advanced Practice Providers or APPs (Physician Assistants and Nurse Practitioners) who all work together to provide you with the care you need, when you need it.  Your next appointment:   1 year(s)  Provider:   Stanly DELENA Leavens, MD or Olivia Pavy, PA-C          We recommend signing up for the patient portal called MyChart.  Sign up information is provided on this After Visit Summary.  MyChart is used to connect with patients for Virtual Visits (Telemedicine).  Patients are able to view lab/test results, encounter notes, upcoming appointments, etc.  Non-urgent messages can be sent to your provider as well.   To learn more about what you can do with MyChart, go to [email or web address].   Other Instructions

## 2024-07-13 ENCOUNTER — Ambulatory Visit: Payer: Self-pay | Admitting: Physician Assistant

## 2024-07-13 DIAGNOSIS — E782 Mixed hyperlipidemia: Secondary | ICD-10-CM

## 2024-07-13 LAB — LIPID PANEL
Chol/HDL Ratio: 3.8 ratio (ref 0.0–4.4)
Cholesterol, Total: 203 mg/dL — ABNORMAL HIGH (ref 100–199)
HDL: 53 mg/dL (ref >39)
LDL Chol Calc (NIH): 135 mg/dL — ABNORMAL HIGH (ref 0–99)
Triglycerides: 85 mg/dL (ref 0–149)
VLDL Cholesterol Cal: 15 mg/dL (ref 5–40)

## 2024-07-14 ENCOUNTER — Other Ambulatory Visit: Payer: Self-pay

## 2024-07-14 MED ORDER — EZETIMIBE 10 MG PO TABS: 10.0000 mg | ORAL_TABLET | Freq: Every day | ORAL | 3 refills | Status: AC

## 2024-07-14 MED ORDER — ROSUVASTATIN CALCIUM 20 MG PO TABS: 20.0000 mg | ORAL_TABLET | Freq: Every day | ORAL | 3 refills | Status: AC

## 2024-07-14 NOTE — Progress Notes (Signed)
 Spoke with patient regarding labs results. She has agreed to take the medication for lipid lowering. Orders have been placed for rp labs in 3 months. Patient is aware that she is to be fasting for this lab draw.
